# Patient Record
Sex: Male | Born: 1970 | Race: White | Hispanic: No | Marital: Married | Smoking: Never smoker
Health system: Southern US, Community
[De-identification: ages and names within clinical notes are randomized; demographics above are authoritative.]

## PROBLEM LIST (undated history)

## (undated) DIAGNOSIS — G4733 Obstructive sleep apnea (adult) (pediatric): Secondary | ICD-10-CM

## (undated) DIAGNOSIS — K219 Gastro-esophageal reflux disease without esophagitis: Secondary | ICD-10-CM

## (undated) DIAGNOSIS — I10 Essential (primary) hypertension: Secondary | ICD-10-CM

## (undated) DIAGNOSIS — E785 Hyperlipidemia, unspecified: Secondary | ICD-10-CM

## (undated) HISTORY — DX: Obstructive sleep apnea (adult) (pediatric): G47.33

## (undated) HISTORY — PX: KNEE ARTHROSCOPY: SUR90

## (undated) HISTORY — DX: Essential (primary) hypertension: I10

## (undated) HISTORY — DX: Gastro-esophageal reflux disease without esophagitis: K21.9

## (undated) HISTORY — PX: TONSILLECTOMY: SUR1361

## (undated) HISTORY — DX: Hyperlipidemia, unspecified: E78.5

---

## 2002-07-17 ENCOUNTER — Encounter: Admission: RE | Admit: 2002-07-17 | Discharge: 2002-07-17 | Payer: Self-pay | Admitting: General Surgery

## 2002-07-17 ENCOUNTER — Encounter: Payer: Self-pay | Admitting: General Surgery

## 2002-08-04 ENCOUNTER — Ambulatory Visit (HOSPITAL_COMMUNITY): Admission: RE | Admit: 2002-08-04 | Discharge: 2002-08-04 | Payer: Self-pay | Admitting: General Surgery

## 2002-08-04 ENCOUNTER — Encounter: Payer: Self-pay | Admitting: General Surgery

## 2007-12-11 ENCOUNTER — Encounter: Admission: RE | Admit: 2007-12-11 | Discharge: 2007-12-11 | Payer: Self-pay | Admitting: Orthopedic Surgery

## 2009-08-22 ENCOUNTER — Inpatient Hospital Stay (HOSPITAL_COMMUNITY): Admission: EM | Admit: 2009-08-22 | Discharge: 2009-08-24 | Payer: Self-pay | Admitting: Nurse Practitioner

## 2009-08-24 ENCOUNTER — Other Ambulatory Visit: Payer: Self-pay | Admitting: Internal Medicine

## 2010-04-14 ENCOUNTER — Ambulatory Visit (HOSPITAL_COMMUNITY)
Admission: RE | Admit: 2010-04-14 | Discharge: 2010-04-14 | Payer: Self-pay | Source: Home / Self Care | Attending: Family Medicine | Admitting: Family Medicine

## 2010-04-18 ENCOUNTER — Other Ambulatory Visit (HOSPITAL_COMMUNITY): Payer: Self-pay | Admitting: Family Medicine

## 2010-04-18 DIAGNOSIS — R1011 Right upper quadrant pain: Secondary | ICD-10-CM

## 2010-04-20 ENCOUNTER — Inpatient Hospital Stay (HOSPITAL_COMMUNITY): Admission: RE | Admit: 2010-04-20 | Payer: Self-pay | Source: Ambulatory Visit

## 2010-04-24 ENCOUNTER — Ambulatory Visit (HOSPITAL_COMMUNITY)
Admission: RE | Admit: 2010-04-24 | Discharge: 2010-04-24 | Disposition: A | Discharge status: Home / Self Care | Payer: 59 | Source: Ambulatory Visit | Attending: Family Medicine | Admitting: Family Medicine

## 2010-04-24 ENCOUNTER — Encounter (HOSPITAL_COMMUNITY): Payer: Self-pay

## 2010-04-24 DIAGNOSIS — R110 Nausea: Secondary | ICD-10-CM | POA: Insufficient documentation

## 2010-04-24 DIAGNOSIS — R1011 Right upper quadrant pain: Secondary | ICD-10-CM | POA: Insufficient documentation

## 2010-04-24 MED ORDER — TECHNETIUM TC 99M MEBROFENIN IV KIT: 5.0000 | PACK | Freq: Once | INTRAVENOUS | Status: DC | PRN

## 2010-04-24 MED ORDER — TECHNETIUM TC 99M MEBROFENIN IV KIT: 5.0000 | PACK | Freq: Once | INTRAVENOUS | Status: AC | PRN

## 2010-05-23 ENCOUNTER — Ambulatory Visit (INDEPENDENT_AMBULATORY_CARE_PROVIDER_SITE_OTHER): Payer: 59 | Admitting: Internal Medicine

## 2010-06-05 LAB — DIFFERENTIAL
Eosinophils Absolute: 0.1 10*3/uL (ref 0.0–0.7)
Eosinophils Relative: 2 % (ref 0–5)
Lymphocytes Relative: 28 % (ref 12–46)
Lymphocytes Relative: 33 % (ref 12–46)
Lymphs Abs: 2.1 10*3/uL (ref 0.7–4.0)
Lymphs Abs: 2.3 10*3/uL (ref 0.7–4.0)
Monocytes Relative: 6 % (ref 3–12)
Monocytes Relative: 7 % (ref 3–12)
Neutro Abs: 4 10*3/uL (ref 1.7–7.7)
Neutrophils Relative %: 59 % (ref 43–77)

## 2010-06-05 LAB — BASIC METABOLIC PANEL
BUN: 20 mg/dL (ref 6–23)
CO2: 27 mEq/L (ref 19–32)
Calcium: 9.1 mg/dL (ref 8.4–10.5)
Chloride: 104 mEq/L (ref 96–112)
Chloride: 108 mEq/L (ref 96–112)
Creatinine, Ser: 0.98 mg/dL (ref 0.4–1.5)
Creatinine, Ser: 1.03 mg/dL (ref 0.4–1.5)
GFR calc Af Amer: >60 mL/min (ref >60)
GFR calc Af Amer: >60 mL/min (ref >60)
GFR calc Af Amer: >60 mL/min (ref >60)
GFR calc non Af Amer: >60 mL/min (ref >60)
GFR calc non Af Amer: >60 mL/min (ref >60)
GFR calc non Af Amer: >60 mL/min (ref >60)
Potassium: 3.6 mEq/L (ref 3.5–5.1)
Sodium: 138 mEq/L (ref 135–145)
Sodium: 141 mEq/L (ref 135–145)

## 2010-06-05 LAB — PROTIME-INR
INR: 1 (ref 0.00–1.49)
Prothrombin Time: 13.1 seconds (ref 11.6–15.2)

## 2010-06-05 LAB — CARDIAC PANEL(CRET KIN+CKTOT+MB+TROPI)
CK, MB: 0.6 ng/mL (ref 0.3–4.0)
CK, MB: 0.6 ng/mL (ref 0.3–4.0)
Relative Index: INVALID (ref 0.0–2.5)
Total CK: 92 U/L (ref 7–232)
Total CK: 98 U/L (ref 7–232)
Troponin I: 0.01 ng/mL (ref 0.00–0.06)
Troponin I: 0.02 ng/mL (ref 0.00–0.06)

## 2010-06-05 LAB — LIPID PANEL
LDL Cholesterol: 139 mg/dL — ABNORMAL HIGH (ref 0–99)
Total CHOL/HDL Ratio: 5.4 RATIO
Triglycerides: 110 mg/dL (ref <150)
VLDL: 22 mg/dL (ref 0–40)

## 2010-06-05 LAB — D-DIMER, QUANTITATIVE: D-Dimer, Quant: 0.23 ug/mL-FEU (ref 0.00–0.48)

## 2010-06-05 LAB — POCT CARDIAC MARKERS
CKMB, poc: <1 ng/mL — ABNORMAL LOW (ref 1.0–8.0)
Troponin i, poc: <0.05 ng/mL (ref 0.00–0.09)

## 2010-06-05 LAB — CBC
HCT: 40.1 % (ref 39.0–52.0)
Hemoglobin: 14.1 g/dL (ref 13.0–17.0)
MCHC: 34.7 g/dL (ref 30.0–36.0)
MCV: 90.7 fL (ref 78.0–100.0)
Platelets: 236 10*3/uL (ref 150–400)
RBC: 3.96 MIL/uL — ABNORMAL LOW (ref 4.22–5.81)
RBC: 4.22 MIL/uL (ref 4.22–5.81)
RBC: 4.43 MIL/uL (ref 4.22–5.81)
WBC: 6.6 10*3/uL (ref 4.0–10.5)
WBC: 6.9 10*3/uL (ref 4.0–10.5)
WBC: 7.3 10*3/uL (ref 4.0–10.5)

## 2010-06-05 LAB — APTT: aPTT: 28 seconds (ref 24–37)

## 2012-01-07 ENCOUNTER — Telehealth (INDEPENDENT_AMBULATORY_CARE_PROVIDER_SITE_OTHER): Payer: Self-pay | Admitting: *Deleted

## 2012-01-07 NOTE — Telephone Encounter (Signed)
[facility] is requesting a refill on Dexilant DR 60 mg capsule, take 1 capsule by mouth 30 min prior to breakfast.

## 2012-01-30 ENCOUNTER — Encounter (INDEPENDENT_AMBULATORY_CARE_PROVIDER_SITE_OTHER): Payer: Self-pay | Admitting: Internal Medicine

## 2012-01-30 ENCOUNTER — Ambulatory Visit (INDEPENDENT_AMBULATORY_CARE_PROVIDER_SITE_OTHER): Payer: 59 | Admitting: Internal Medicine

## 2012-01-30 VITALS — BP 110/72 | HR 66 | Temp 98.1°F | Ht 72.0 in | Wt 293.2 lb

## 2012-01-30 DIAGNOSIS — K219 Gastro-esophageal reflux disease without esophagitis: Secondary | ICD-10-CM | POA: Insufficient documentation

## 2012-01-30 MED ORDER — DEXLANSOPRAZOLE 60 MG PO CPDR: 60.0000 mg | DELAYED_RELEASE_CAPSULE | Freq: Every day | ORAL | Status: DC

## 2012-01-30 NOTE — Progress Notes (Signed)
Subjective:     Patient ID: PIO STADTLANDER, male   DOB: 1970-10-05, 41 y.o.   MRN: [id number]  HPI Here today for f/u of his GERD. Acid reflux is controlled with Dexilant. He usually has problems at night when he sleeps. He tells me when he does have acid reflux, it is usually because he has ate too late or ate spicy foods. Appetite is good. No weight loss. Rare acid reflux. BMs usually twice a day. No GI problems. No melena or bright red rectal bleeding. No exercising.   Review of Systems see hpi Current Outpatient Prescriptions  Medication Sig Dispense Refill  . dexlansoprazole (DEXILANT) 60 MG capsule Take 60 mg by mouth daily.      Marland Kitchen lisinopril (PRINIVIL,ZESTRIL) 20 MG tablet Take 20 mg by mouth daily.       Past Medical History  Diagnosis Date  . GERD (gastroesophageal reflux disease)   . Hypertension       No Known Allergies  Objective:   Physical Exam Filed Vitals:   01/30/12 1439  BP: 110/72  Pulse: 66  Temp: 98.1 F (36.7 C)  Height: 6' (1.829 m)  Weight: 293 lb 3.2 oz (132.995 kg)   Alert and oriented. Skin warm and dry. Oral mucosa is moist.   . Sclera anicteric, conjunctivae is pink. Thyroid not enlarged. No cervical lymphadenopathy. Lungs clear. Heart regular rate and rhythm.  Abdomen is soft. Bowel sounds are positive. No hepatomegaly. No abdominal masses felt. No tenderness.  No edema to lower extremities.      Assessment:    GERD controlled this time with Dexilant.    Plan:    Try to lose weight. Continue Dexilant. [facility]. Rx for Dexilant.

## 2012-01-30 NOTE — Patient Instructions (Addendum)
Dexilant RX. Continue. OV in 1 yr.

## 2012-02-18 ENCOUNTER — Emergency Department (HOSPITAL_COMMUNITY)
Admission: EM | Admit: 2012-02-18 | Discharge: 2012-02-18 | Disposition: A | Discharge status: Home / Self Care | Payer: 59 | Attending: Emergency Medicine | Admitting: Emergency Medicine

## 2012-02-18 ENCOUNTER — Emergency Department (HOSPITAL_COMMUNITY): Payer: 59

## 2012-02-18 ENCOUNTER — Encounter (HOSPITAL_COMMUNITY): Payer: Self-pay | Admitting: *Deleted

## 2012-02-18 DIAGNOSIS — R079 Chest pain, unspecified: Secondary | ICD-10-CM | POA: Insufficient documentation

## 2012-02-18 DIAGNOSIS — F430 Acute stress reaction: Secondary | ICD-10-CM | POA: Insufficient documentation

## 2012-02-18 DIAGNOSIS — Z79899 Other long term (current) drug therapy: Secondary | ICD-10-CM | POA: Insufficient documentation

## 2012-02-18 DIAGNOSIS — K219 Gastro-esophageal reflux disease without esophagitis: Secondary | ICD-10-CM | POA: Insufficient documentation

## 2012-02-18 DIAGNOSIS — I10 Essential (primary) hypertension: Secondary | ICD-10-CM | POA: Insufficient documentation

## 2012-02-18 LAB — CBC WITH DIFFERENTIAL/PLATELET
Basophils Relative: 0 % (ref 0–1)
Eosinophils Absolute: 0.1 10*3/uL (ref 0.0–0.7)
Lymphs Abs: 1.5 10*3/uL (ref 0.7–4.0)
MCH: 30.8 pg (ref 26.0–34.0)
MCHC: 34.9 g/dL (ref 30.0–36.0)
Neutro Abs: 4.3 10*3/uL (ref 1.7–7.7)
Neutrophils Relative %: 67 % (ref 43–77)
Platelets: 287 10*3/uL (ref 150–400)
RBC: 4.77 MIL/uL (ref 4.22–5.81)

## 2012-02-18 LAB — HEPATIC FUNCTION PANEL
ALT: 56 U/L — ABNORMAL HIGH (ref 0–53)
AST: 25 U/L (ref 0–37)
Alkaline Phosphatase: 157 U/L — ABNORMAL HIGH (ref 39–117)
Total Protein: 6.9 g/dL (ref 6.0–8.3)

## 2012-02-18 LAB — BASIC METABOLIC PANEL
GFR calc Af Amer: >90 mL/min (ref >90)
GFR calc non Af Amer: 86 mL/min — ABNORMAL LOW (ref >90)
Potassium: 3.7 mEq/L (ref 3.5–5.1)
Sodium: 138 mEq/L (ref 135–145)

## 2012-02-18 NOTE — ED Provider Notes (Signed)
History     CSN: [id number]  Arrival date & time 02/18/12  1729   First MD Initiated Contact with Patient 02/18/12 1834      Chief Complaint  Patient presents with  . Chest Pain    (Consider location/radiation/quality/duration/timing/severity/associated sxs/prior treatment) Patient is a 41 y.o. male presenting with chest pain. The history is provided by the patient (the pt complains of chest pain). No language interpreter was used.  Chest Pain The chest pain began 1 - 2 hours ago. Chest pain occurs frequently. The chest pain is unchanged. The pain is associated with stress. At its most intense, the pain is at 4/10. The pain is currently at 3/10. The severity of the pain is moderate. The quality of the pain is described as aching. The pain does not radiate. Chest pain is worsened by stress. Pertinent negatives for primary symptoms include no fever, no fatigue, no cough and no abdominal pain.  Pertinent negatives for past medical history include no seizures.     Past Medical History  Diagnosis Date  . GERD (gastroesophageal reflux disease)   . Hypertension     Past Surgical History  Procedure Date  . Knee arthroscopy   . Tonsillectomy     History reviewed. No pertinent family history.  History  Substance Use Topics  . Smoking status: Never Smoker   . Smokeless tobacco: Not on file  . Alcohol Use: Yes     Comment: occasionally      Review of Systems  Constitutional: Negative for fever and fatigue.  HENT: Negative for congestion, sinus pressure and ear discharge.   Eyes: Negative for discharge.  Respiratory: Negative for cough.   Cardiovascular: Positive for chest pain.  Gastrointestinal: Negative for abdominal pain and diarrhea.  Genitourinary: Negative for frequency and hematuria.  Musculoskeletal: Negative for back pain.  Skin: Negative for rash.  Neurological: Negative for seizures and headaches.  Hematological: Negative.   Psychiatric/Behavioral: Negative  for hallucinations.    Allergies  Review of patient's allergies indicates no known allergies.  Home Medications   Current Outpatient Rx  Name  Route  Sig  Dispense  Refill  . ASPIRIN EC 325 MG PO TBEC   Oral   Take 650 mg by mouth once as needed. For chest pain         . DEXLANSOPRAZOLE 60 MG PO CPDR   Oral   Take 60 mg by mouth at bedtime.         . IBUPROFEN 200 MG PO TABS   Oral   Take 600 mg by mouth daily as needed. For occasional pain         . LISINOPRIL 40 MG PO TABS   Oral   Take 40 mg by mouth every morning.         . ADULT MULTIVITAMIN W/MINERALS CH   Oral   Take 2 tablets by mouth every morning.           BP 128/64  Pulse 86  Temp 97.8 F (36.6 C) (Oral)  Resp 20  Ht 6' (1.829 m)  Wt 280 lb (127.007 kg)  BMI 37.97 kg/m2  SpO2 99%  Physical Exam  Constitutional: He is oriented to person, place, and time. He appears well-developed.  HENT:  Head: Normocephalic and atraumatic.  Eyes: Conjunctivae normal and EOM are normal. No scleral icterus.  Neck: Neck supple. No thyromegaly present.  Cardiovascular: Normal rate and regular rhythm.  Exam reveals no gallop and no friction rub.   No  murmur heard. Pulmonary/Chest: No stridor. He has no wheezes. He has no rales. He exhibits no tenderness.  Abdominal: He exhibits no distension. There is no tenderness. There is no rebound.  Musculoskeletal: Normal range of motion. He exhibits no edema.  Lymphadenopathy:    He has no cervical adenopathy.  Neurological: He is oriented to person, place, and time. Coordination normal.  Skin: No rash noted. No erythema.  Psychiatric: He has a normal mood and affect. His behavior is normal.    ED Course  Procedures (including critical care time)  Labs Reviewed  BASIC METABOLIC PANEL - Abnormal; Notable for the following:    Glucose, Bld 107 (*)     GFR calc non Af Amer 86 (*)     All other components within normal limits  HEPATIC FUNCTION PANEL - Abnormal;  Notable for the following:    ALT 56 (*)     Alkaline Phosphatase 157 (*)     All other components within normal limits  CBC WITH DIFFERENTIAL  TROPONIN I   Dg Chest 2 View  02/18/2012  *RADIOLOGY REPORT*  Clinical Data: Chest pain.  CHEST - 2 VIEW  Comparison: 08/22/2009.  Findings: Normal sized heart.  Clear lungs with normal vascularity. Unremarkable bones.  IMPRESSION: No acute abnormality.   Original Report Authenticated By: Beckie Salts, M.D.      1. Chest pain     Date: 02/18/2012  Rate:83  Rhythm: normal sinus rhythm  QRS Axis: normal  Intervals: normal  ST/T Wave abnormalities: normal  Conduction Disutrbances:none  Narrative Interpretation:   Old EKG Reviewed: none available     MDM          Benny Lennert, MD 02/19/12 1342

## 2012-02-18 NOTE — ED Notes (Signed)
Chest "tightness" nausea, and sob,  Alert,

## 2012-05-03 ENCOUNTER — Other Ambulatory Visit: Payer: Self-pay

## 2012-08-15 ENCOUNTER — Telehealth: Payer: Self-pay | Admitting: Family Medicine

## 2012-08-15 MED ORDER — LISINOPRIL 40 MG PO TABS: 40.0000 mg | ORAL_TABLET | Freq: Every morning | ORAL | Status: DC

## 2012-08-15 NOTE — Telephone Encounter (Signed)
Rx Refilled  

## 2012-08-18 ENCOUNTER — Encounter: Payer: Self-pay | Admitting: Family Medicine

## 2012-08-18 ENCOUNTER — Ambulatory Visit (INDEPENDENT_AMBULATORY_CARE_PROVIDER_SITE_OTHER): Payer: 59 | Admitting: Family Medicine

## 2012-08-18 VITALS — BP 124/78 | HR 68 | Temp 98.3°F | Resp 18 | Wt 283.0 lb

## 2012-08-18 DIAGNOSIS — E785 Hyperlipidemia, unspecified: Secondary | ICD-10-CM | POA: Insufficient documentation

## 2012-08-18 DIAGNOSIS — B351 Tinea unguium: Secondary | ICD-10-CM

## 2012-08-18 DIAGNOSIS — I10 Essential (primary) hypertension: Secondary | ICD-10-CM

## 2012-08-18 LAB — COMPLETE METABOLIC PANEL WITH GFR
ALT: 46 U/L (ref 0–53)
BUN: 20 mg/dL (ref 6–23)
CO2: 27 mEq/L (ref 19–32)
Calcium: 9.5 mg/dL (ref 8.4–10.5)
Chloride: 104 mEq/L (ref 96–112)
Creat: 1.13 mg/dL (ref 0.50–1.35)
GFR, Est African American: >89 mL/min
GFR, Est Non African American: 79 mL/min
Glucose, Bld: 106 mg/dL — ABNORMAL HIGH (ref 70–99)

## 2012-08-18 LAB — LIPID PANEL
Cholesterol: 190 mg/dL (ref 0–200)
LDL Cholesterol: 135 mg/dL — ABNORMAL HIGH (ref 0–99)
Triglycerides: 81 mg/dL (ref <150)

## 2012-08-18 MED ORDER — TERBINAFINE HCL 250 MG PO TABS: 250.0000 mg | ORAL_TABLET | Freq: Every day | ORAL | Status: DC

## 2012-08-18 NOTE — Progress Notes (Signed)
  Subjective:    Patient ID: Travis Sanders, male    DOB: 1970-07-09, 42 y.o.   MRN: [id number]  HPI Patient is here for followup of his hypertension. He is currently taking lisinopril 40 mg by mouth daily. He denies any chest pain, shortness of breath, dyspnea on exertion, orthopnea. He also denies any daily cough. Blood pressure is currently well controlled at 124/78. His last office visit he was found to have an LDL cholesterol greater than 170. He never got pravastatin that I prescribed. He wanted to try diet and exercise. He's been unsuccessful at this. His weight has not changed. He is now willing to try the cholesterol medicines if his labs are still elevated. He requested be checked today. He has a has toenail fungus on both feet. He is pink yellow dystrophic nails on all 10 toes. He is interested in Lamisil. Past Medical History  Diagnosis Date  . Hypertension   . GERD (gastroesophageal reflux disease)   . Hyperlipidemia    Current Outpatient Prescriptions on File Prior to Visit  Medication Sig Dispense Refill  . dexlansoprazole (DEXILANT) 60 MG capsule Take 60 mg by mouth at bedtime.      Marland Kitchen ibuprofen (ADVIL,MOTRIN) 200 MG tablet Take 600 mg by mouth daily as needed. For occasional pain      . lisinopril (PRINIVIL,ZESTRIL) 40 MG tablet Take 1 tablet (40 mg total) by mouth every morning.  30 tablet  5  . Multiple Vitamin (MULTIVITAMIN WITH MINERALS) TABS Take 2 tablets by mouth every morning.       No current facility-administered medications on file prior to visit.   No Known Allergies History   Social History  . Marital Status: Married    Spouse Name: N/A    Number of Children: N/A  . Years of Education: N/A   Occupational History  . Not on file.   Social History Main Topics  . Smoking status: Never Smoker   . Smokeless tobacco: Not on file  . Alcohol Use: Yes     Comment: occasionally  . Drug Use: No  . Sexually Active: Not on file   Other Topics Concern  . Not  on file   Social History Narrative  . No narrative on file       Review of Systems  All other systems reviewed and are negative.       Objective:   Physical Exam  Vitals reviewed. Constitutional: He appears well-developed and well-nourished.  Cardiovascular: Normal rate, regular rhythm, normal heart sounds and intact distal pulses.   No murmur heard. Pulmonary/Chest: Effort normal and breath sounds normal. No respiratory distress. He has no wheezes. He has no rales.  Abdominal: Soft. Bowel sounds are normal. He exhibits no distension. There is no tenderness. There is no rebound and no guarding.          Assessment & Plan:  1. HTN (hypertension) Blood pressures currently well controlled, continue lisinopril 40 mg by mouth  2. HLD (hyperlipidemia) Goal LDL is less than 130. Check fasting lipid panel - COMPLETE METABOLIC PANEL WITH GFR - Lipid Panel  #3 onychomycosis Lamisil 250 mg by mouth daily. Dispense 30 tablets with 2 refills. Instructed the patient to return every month for a CMP while he is taking medication to monitor for  liver toxicity.

## 2012-09-03 ENCOUNTER — Encounter: Payer: Self-pay | Admitting: Family Medicine

## 2012-12-22 ENCOUNTER — Ambulatory Visit (INDEPENDENT_AMBULATORY_CARE_PROVIDER_SITE_OTHER): Payer: 59 | Admitting: Family Medicine

## 2012-12-22 ENCOUNTER — Encounter: Payer: Self-pay | Admitting: Family Medicine

## 2012-12-22 VITALS — BP 130/86 | HR 84 | Temp 98.4°F | Resp 18 | Ht 72.0 in | Wt 287.0 lb

## 2012-12-22 DIAGNOSIS — R220 Localized swelling, mass and lump, head: Secondary | ICD-10-CM

## 2012-12-22 LAB — CBC WITH DIFFERENTIAL/PLATELET
Basophils Absolute: 0 10*3/uL (ref 0.0–0.1)
Eosinophils Relative: 2 % (ref 0–5)
HCT: 42.3 % (ref 39.0–52.0)
Lymphocytes Relative: 27 % (ref 12–46)
Lymphs Abs: 1.6 10*3/uL (ref 0.7–4.0)
MCV: 87.9 fL (ref 78.0–100.0)
Monocytes Absolute: 0.4 10*3/uL (ref 0.1–1.0)
RDW: 14.2 % (ref 11.5–15.5)
WBC: 5.9 10*3/uL (ref 4.0–10.5)

## 2012-12-22 LAB — BASIC METABOLIC PANEL WITH GFR
CO2: 28 mEq/L (ref 19–32)
Chloride: 104 mEq/L (ref 96–112)
Creat: 1.12 mg/dL (ref 0.50–1.35)
Glucose, Bld: 138 mg/dL — ABNORMAL HIGH (ref 70–99)

## 2012-12-22 MED ORDER — LISINOPRIL 40 MG PO TABS: 40.0000 mg | ORAL_TABLET | Freq: Every morning | ORAL | Status: DC

## 2012-12-22 NOTE — Progress Notes (Signed)
Subjective:    Patient ID: Travis Sanders, male    DOB: 02/16/1971, 42 y.o.   MRN: [id number]  HPI  Patient reports a three-week history of mass in his left neck. The mass is located at the base of the sternocleidomastoid muscle. Is approximately 3 cm x 1 cm. The margins are not well circumscribed. There is a palpable swelling in the muscle. It is tender to palpation. The patient denies any fevers or chills. He denies any night sweats. He denies any bleeding or bruising. He denies any weight loss or fatigue. He denies any recent infection. He does complain of a sore throat and some odynophagia.  He denies any hematemesis or hemoptysis.  He has no history of tobacco abuse. Past Medical History  Diagnosis Date  . Hypertension   . GERD (gastroesophageal reflux disease)   . Hyperlipidemia    Past Surgical History  Procedure Laterality Date  . Knee arthroscopy    . Tonsillectomy     Current Outpatient Prescriptions on File Prior to Visit  Medication Sig Dispense Refill  . dexlansoprazole (DEXILANT) 60 MG capsule Take 60 mg by mouth at bedtime.      Marland Kitchen ibuprofen (ADVIL,MOTRIN) 200 MG tablet Take 600 mg by mouth daily as needed. For occasional pain      . Multiple Vitamin (MULTIVITAMIN WITH MINERALS) TABS Take 2 tablets by mouth every morning.      . terbinafine (LAMISIL) 250 MG tablet Take 1 tablet (250 mg total) by mouth daily.  30 tablet  2   No current facility-administered medications on file prior to visit.   No Known Allergies History   Social History  . Marital Status: Married    Spouse Name: N/A    Number of Children: N/A  . Years of Education: N/A   Occupational History  . Not on file.   Social History Main Topics  . Smoking status: Never Smoker   . Smokeless tobacco: Not on file  . Alcohol Use: Yes     Comment: occasionally  . Drug Use: No  . Sexual Activity: Not on file   Other Topics Concern  . Not on file   Social History Narrative  . No narrative on file      Review of Systems  All other systems reviewed and are negative.       Objective:   Physical Exam  Vitals reviewed. Constitutional: He appears well-developed and well-nourished. No distress.  HENT:  Head: Normocephalic and atraumatic.  Right Ear: External ear normal.  Left Ear: External ear normal.  Nose: Nose normal.  Mouth/Throat: Oropharynx is clear and moist. No oropharyngeal exudate.  Eyes: Conjunctivae are normal. Pupils are equal, round, and reactive to light. Right eye exhibits no discharge. Left eye exhibits no discharge. No scleral icterus.  Neck: Neck supple. No JVD present. No tracheal deviation present. No thyromegaly present.  Cardiovascular: Normal rate, regular rhythm and normal heart sounds.  Exam reveals no gallop and no friction rub.   No murmur heard. Pulmonary/Chest: Effort normal and breath sounds normal. No respiratory distress. He has no wheezes. He has no rales.  Lymphadenopathy:    He has no cervical adenopathy.  Skin: He is not diaphoretic.   2 cm x 1 cm vague mass palpable in the base of the left sternocleidomastoid muscle        Assessment & Plan:  1. Swelling, mass, or lump in head and neck Schedule patient for a CT scan of the neck. I believe this  is likely a spasm in the muscle. He is very concerned that there may be a cancer though the muscle. It is tender to palpation. However a CT scan and await results before formulating a final plan.. - BASIC METABOLIC PANEL WITH GFR - CBC with Differential

## 2012-12-26 ENCOUNTER — Encounter (HOSPITAL_COMMUNITY): Payer: Self-pay

## 2012-12-26 ENCOUNTER — Ambulatory Visit (HOSPITAL_COMMUNITY)
Admission: RE | Admit: 2012-12-26 | Discharge: 2012-12-26 | Disposition: A | Discharge status: Home / Self Care | Payer: 59 | Source: Ambulatory Visit | Attending: Family Medicine | Admitting: Family Medicine

## 2012-12-26 DIAGNOSIS — I6529 Occlusion and stenosis of unspecified carotid artery: Secondary | ICD-10-CM | POA: Insufficient documentation

## 2012-12-26 DIAGNOSIS — R220 Localized swelling, mass and lump, head: Secondary | ICD-10-CM

## 2012-12-26 DIAGNOSIS — M542 Cervicalgia: Secondary | ICD-10-CM | POA: Insufficient documentation

## 2012-12-26 MED ORDER — IOHEXOL 300 MG/ML  SOLN
80.0000 mL | Freq: Once | INTRAMUSCULAR | Status: AC | PRN
  Administered 2012-12-26: 75 mL via INTRAVENOUS

## 2012-12-29 ENCOUNTER — Other Ambulatory Visit: Payer: Self-pay | Admitting: Family Medicine

## 2012-12-29 MED ORDER — CYCLOBENZAPRINE HCL 10 MG PO TABS: 10.0000 mg | ORAL_TABLET | Freq: Three times a day (TID) | ORAL | Status: DC | PRN

## 2012-12-29 NOTE — Telephone Encounter (Signed)
Med sent to pharm 

## 2013-01-22 ENCOUNTER — Other Ambulatory Visit: Payer: Self-pay

## 2013-01-23 ENCOUNTER — Other Ambulatory Visit: Payer: Self-pay | Admitting: Family Medicine

## 2013-02-03 ENCOUNTER — Other Ambulatory Visit (INDEPENDENT_AMBULATORY_CARE_PROVIDER_SITE_OTHER): Payer: Self-pay | Admitting: Internal Medicine

## 2013-02-05 ENCOUNTER — Encounter (INDEPENDENT_AMBULATORY_CARE_PROVIDER_SITE_OTHER): Payer: Self-pay | Admitting: *Deleted

## 2013-04-10 ENCOUNTER — Telehealth (INDEPENDENT_AMBULATORY_CARE_PROVIDER_SITE_OTHER): Payer: Self-pay | Admitting: *Deleted

## 2013-04-10 NOTE — Telephone Encounter (Signed)
Patient called to check on the PA for [place]. Advised him Lynelle Smoke is working on the PA and he will be called once it has been approved. Zubayr is out his Dexilant. Spoke with Dr. Laural Golden and he said patient could get OTC Zegerid, take 2 tablets daily.

## 2013-04-10 NOTE — Telephone Encounter (Signed)
Attempted to do PA over the phone but they stated that paper work had to be done. Rec'd the paper work , working on it. Monday will get Dr.Rehman's signature and fax back. I was told that from the time they rec'v it , to allow 48-78 hours for their response.

## 2013-05-18 ENCOUNTER — Ambulatory Visit (INDEPENDENT_AMBULATORY_CARE_PROVIDER_SITE_OTHER): Payer: 59 | Admitting: Internal Medicine

## 2013-05-19 ENCOUNTER — Encounter (INDEPENDENT_AMBULATORY_CARE_PROVIDER_SITE_OTHER): Payer: Self-pay | Admitting: Internal Medicine

## 2013-05-19 ENCOUNTER — Ambulatory Visit (INDEPENDENT_AMBULATORY_CARE_PROVIDER_SITE_OTHER): Payer: BC Managed Care – PPO | Admitting: Internal Medicine

## 2013-05-19 VITALS — BP 144/80 | HR 76 | Temp 98.0°F | Resp 18 | Ht 72.0 in | Wt 284.6 lb

## 2013-05-19 DIAGNOSIS — K219 Gastro-esophageal reflux disease without esophagitis: Secondary | ICD-10-CM

## 2013-05-19 DIAGNOSIS — E669 Obesity, unspecified: Secondary | ICD-10-CM | POA: Insufficient documentation

## 2013-05-19 DIAGNOSIS — K7689 Other specified diseases of liver: Secondary | ICD-10-CM

## 2013-05-19 DIAGNOSIS — K760 Fatty (change of) liver, not elsewhere classified: Secondary | ICD-10-CM | POA: Insufficient documentation

## 2013-05-19 NOTE — Patient Instructions (Addendum)
Keep symptom diary for the next 3 months and send [place] a summary. Consider exercise 3-4 times a week as we discussed

## 2013-05-19 NOTE — Progress Notes (Signed)
Presenting complaint;  Followup for GERD.  Subjective:  Patient is a 43 year old Caucasian male who has history of GERD and is here for scheduled visit. He was last seen in November 2013. He states he is doing well as long as he watches what he eats and he stays on his medications. While he rarely experiences heartburn he still has a few episodes of nocturnal regurgitation which almost always occurs around 2 AM. He has not been able to pinpoint association with foods. He weighs at least 5 hours before he goes to bed. He he feels burping at bedtime he usually has an indication that he may have regurgitation. He also has occasional feeling of lump in his throat. He denies sore throat hoarseness chest pain or dysphagia. He has lost 9 pounds since his last visit. He realizes he has a long way to go. She denies abdominal pain melena or rectal bleeding.        Current Medications: Current Outpatient Prescriptions  Medication Sig Dispense Refill  . acetaminophen (TYLENOL) 500 MG tablet Take 500 mg by mouth as needed for fever (For Colds).      . DEXILANT 60 MG capsule TAKE ONE CAPSULE BY MOUTH EVERY DAY  30 capsule  5  . lisinopril (PRINIVIL,ZESTRIL) 40 MG tablet Take 1 tablet (40 mg total) by mouth every morning.  30 tablet  5  . Multiple Vitamin (MULTIVITAMIN WITH MINERALS) TABS Take 2 tablets by mouth every morning.      Marland Kitchen VIAGRA 100 MG tablet TAKE 1 TABLET BY MOUTH DAILY AS NEEDED  5 tablet  11   No current facility-administered medications for this visit.     Objective: Blood pressure 144/80, pulse 76, temperature 98 F (36.7 C), temperature source Oral, resp. rate 18, height 6' (1.829 m), weight 284 lb 9.6 oz (129.094 kg). Patient is alert and in no acute distress. Conjunctiva is pink. Sclera is nonicteric Oropharyngeal mucosa is normal. No neck masses or thyromegaly noted. Cardiac exam with regular rhythm normal S1 and S2. No murmur or gallop noted. Lungs are clear to  auscultation. Abdomen is full but soft and nontender without organomegaly or masses.  No LE edema or clubbing noted.  Labs/studies Results: LFTs from 08-18-2012. Bilirubin 0.5, BP 134, AST 30, ALT 46 and albumin 4.7 Ultrasound from 04/14/2010 revealed minimally echogenic liver.   Assessment:  #1. GERD. Patient has uncomplicated GERD and is doing well with therapy. He is still having intermittent nocturnal regurgitation. If this is happening too frequently he may need bedtime H2B. #2. Fatty liver. Transaminases in June 2014 were within normal limits. He must exercise and try to lose weight in order to prevent aggressive liver disease.   Plan:  Continue Dexilant at 60 mg by mouth every morning. Patient which he record as to frequency of nocturnal regurgitation and send [place] a summary in 3 months. He will try Gaviscon 2 tablets each bedtime when necessary when he has burping spells. Will consider EGD at some point in future possibly at the time of screening colonoscopy. Patient encouraged to start exercising or walking on a treadmill for at least 30 minutes each time at least 4 times a week. Office visit in one year.

## 2013-08-13 ENCOUNTER — Other Ambulatory Visit (INDEPENDENT_AMBULATORY_CARE_PROVIDER_SITE_OTHER): Payer: Self-pay | Admitting: Internal Medicine

## 2013-08-17 ENCOUNTER — Other Ambulatory Visit: Payer: Self-pay | Admitting: Family Medicine

## 2013-08-17 MED ORDER — LISINOPRIL 40 MG PO TABS: 40.0000 mg | ORAL_TABLET | Freq: Every morning | ORAL | Status: DC

## 2013-08-17 NOTE — Telephone Encounter (Signed)
Rx Refilled  

## 2014-01-27 ENCOUNTER — Other Ambulatory Visit: Payer: Self-pay | Admitting: Family Medicine

## 2014-01-28 ENCOUNTER — Encounter: Payer: Self-pay | Admitting: *Deleted

## 2014-01-28 NOTE — Telephone Encounter (Signed)
ok 

## 2014-01-28 NOTE — Telephone Encounter (Signed)
Ok to refill??  Last office visit 12/22/2012.  Last refill 01/23/2013.

## 2014-02-03 ENCOUNTER — Telehealth: Payer: Self-pay | Admitting: Family Medicine

## 2014-02-03 MED ORDER — LISINOPRIL 40 MG PO TABS: 40.0000 mg | ORAL_TABLET | Freq: Every morning | ORAL | Status: DC

## 2014-02-03 NOTE — Telephone Encounter (Signed)
Med sent to pharm for 30 days only

## 2014-02-03 NOTE — Telephone Encounter (Signed)
Patient has appt on December 7th, and is going to run out of blood pressure medication before then, would like to know if we can refill until he can get his appointment  [phone]

## 2014-02-04 ENCOUNTER — Other Ambulatory Visit: Payer: Self-pay | Admitting: Family Medicine

## 2014-02-04 ENCOUNTER — Other Ambulatory Visit (INDEPENDENT_AMBULATORY_CARE_PROVIDER_SITE_OTHER): Payer: Self-pay | Admitting: Internal Medicine

## 2014-02-04 ENCOUNTER — Encounter (INDEPENDENT_AMBULATORY_CARE_PROVIDER_SITE_OTHER): Payer: Self-pay | Admitting: *Deleted

## 2014-02-22 ENCOUNTER — Ambulatory Visit (INDEPENDENT_AMBULATORY_CARE_PROVIDER_SITE_OTHER): Payer: BC Managed Care – PPO | Admitting: Family Medicine

## 2014-02-22 ENCOUNTER — Encounter: Payer: Self-pay | Admitting: Family Medicine

## 2014-02-22 VITALS — BP 120/74 | HR 78 | Temp 97.9°F | Resp 18 | Ht 72.0 in | Wt 286.0 lb

## 2014-02-22 DIAGNOSIS — M7551 Bursitis of right shoulder: Secondary | ICD-10-CM

## 2014-02-22 DIAGNOSIS — K219 Gastro-esophageal reflux disease without esophagitis: Secondary | ICD-10-CM

## 2014-02-22 DIAGNOSIS — R053 Chronic cough: Secondary | ICD-10-CM

## 2014-02-22 DIAGNOSIS — I10 Essential (primary) hypertension: Secondary | ICD-10-CM

## 2014-02-22 DIAGNOSIS — R05 Cough: Secondary | ICD-10-CM

## 2014-02-23 ENCOUNTER — Encounter: Payer: Self-pay | Admitting: Family Medicine

## 2014-02-23 NOTE — Progress Notes (Signed)
Subjective:    Patient ID: Travis Sanders, male    DOB: September 10, 1970, 43 y.o.   MRN: [id number]  HPI  Patient is here today for follow-up of his hypertension.  He denies any chest pain shortness of breath or dyspnea on exertion. He does have a very recalcitrant acid reflux currently managed with dexilant.  Frequently the patient will experience reflux even on the medication consisting of a burning foul tasting bitter fluid regurgitated into his mouth when he lies down at night.  He also reports a dry hacking nonproductive cough for over 2-3 months. Patient also complains of right shoulder pain 1 month. The pain is worse with abduction greater than 90. It is exacerbated by internal and external rotation of the shoulder. It is worse with overhead activities. Past Medical History  Diagnosis Date  . Hypertension   . GERD (gastroesophageal reflux disease)   . Hyperlipidemia    Past Surgical History  Procedure Laterality Date  . Knee arthroscopy    . Tonsillectomy     Current Outpatient Prescriptions on File Prior to Visit  Medication Sig Dispense Refill  . DEXILANT 60 MG capsule TAKE ONE CAPSULE BY MOUTH EVERY DAY 30 capsule 5  . lisinopril (PRINIVIL,ZESTRIL) 40 MG tablet Take 1 tablet (40 mg total) by mouth every morning. 30 tablet 0  . Multiple Vitamin (MULTIVITAMIN WITH MINERALS) TABS Take 2 tablets by mouth every morning.    Marland Kitchen VIAGRA 100 MG tablet TAKE 1 TABLET BY MOUTH EVERY DAY AS NEEDED 5 tablet 9   No current facility-administered medications on file prior to visit.   No Known Allergies History   Social History  . Marital Status: Married    Spouse Name: N/A    Number of Children: N/A  . Years of Education: N/A   Occupational History  . Not on file.   Social History Main Topics  . Smoking status: Never Smoker   . Smokeless tobacco: Never Used  . Alcohol Use: Yes     Comment: occasionally  . Drug Use: No  . Sexual Activity: Not on file   Other Topics Concern  .  Not on file   Social History Narrative     Review of Systems  All other systems reviewed and are negative.      Objective:   Physical Exam  Cardiovascular: Normal rate, regular rhythm and normal heart sounds.   No murmur heard. Pulmonary/Chest: Effort normal and breath sounds normal. No respiratory distress. He has no wheezes. He has no rales.  Abdominal: Soft. Bowel sounds are normal. He exhibits no distension. There is no tenderness. There is no rebound and no guarding.  Musculoskeletal: He exhibits no edema.       Right shoulder: He exhibits decreased range of motion, tenderness and pain. He exhibits no bony tenderness and normal strength.  Vitals reviewed.         Assessment & Plan:  Benign essential HTN  Subacromial bursitis, right  Chronic cough  Gastroesophageal reflux disease without esophagitis  Patient's blood pressures well controlled. I will have the patient return fasting for a CMP and fasting lipid panel. I will have the patient discontinue lisinopril due to the cough. I will replace the medication with Benicar 40 mg by mouth daily. If his cough subsides, I will permanently switch the patient to losartan 100 mg by mouth daily in 3 weeks. The cough may also be secondary to his acid reflux. Therefore I recommended that he elevate the head of his  bed 2 inches to help prevent reflux at night. I believe the patient has subacromial bursitis and tendinitis in his right shoulder. We discussed conservative strategies to help manage this. If no better in 3 weeks, the patient like to proceed with cortisone injection at that time.

## 2014-03-08 ENCOUNTER — Telehealth: Payer: Self-pay | Admitting: Family Medicine

## 2014-03-08 ENCOUNTER — Other Ambulatory Visit: Payer: BC Managed Care – PPO

## 2014-03-08 DIAGNOSIS — I10 Essential (primary) hypertension: Secondary | ICD-10-CM

## 2014-03-08 MED ORDER — LOSARTAN POTASSIUM 100 MG PO TABS: 100.0000 mg | ORAL_TABLET | Freq: Every day | ORAL | Status: DC

## 2014-03-08 NOTE — Telephone Encounter (Signed)
[phone]  PT was given samples of the benicar for (bp) and it has worked well and he would like to have a rx sent in [place]

## 2014-03-08 NOTE — Telephone Encounter (Signed)
Called and spoke to pt and he is aware that we are switching him to losartan. Med sent to pharm.

## 2014-03-09 LAB — COMPLETE METABOLIC PANEL WITH GFR
ALBUMIN: 4.3 g/dL (ref 3.5–5.2)
ALK PHOS: 131 U/L — AB (ref 39–117)
ALT: 43 U/L (ref 0–53)
AST: 25 U/L (ref 0–37)
BILIRUBIN TOTAL: 0.5 mg/dL (ref 0.2–1.2)
BUN: 17 mg/dL (ref 6–23)
CO2: 25 mEq/L (ref 19–32)
Calcium: 9.4 mg/dL (ref 8.4–10.5)
Chloride: 101 mEq/L (ref 96–112)
Creat: 1.07 mg/dL (ref 0.50–1.35)
GFR, Est African American: >89 mL/min
GFR, Est Non African American: 83 mL/min
GLUCOSE: 102 mg/dL — AB (ref 70–99)
POTASSIUM: 4.7 meq/L (ref 3.5–5.3)
SODIUM: 142 meq/L (ref 135–145)
TOTAL PROTEIN: 6.7 g/dL (ref 6.0–8.3)

## 2014-03-09 LAB — LIPID PANEL
CHOL/HDL RATIO: 5.2 ratio
Cholesterol: 204 mg/dL — ABNORMAL HIGH (ref 0–200)
HDL: 39 mg/dL — AB (ref >39)
LDL CALC: 148 mg/dL — AB (ref 0–99)
Triglycerides: 86 mg/dL (ref <150)
VLDL: 17 mg/dL (ref 0–40)

## 2014-04-16 ENCOUNTER — Telehealth (INDEPENDENT_AMBULATORY_CARE_PROVIDER_SITE_OTHER): Payer: Self-pay | Admitting: *Deleted

## 2014-04-16 NOTE — Telephone Encounter (Signed)
A call was made to the patient's insurance. PA was done. Dexilant is NOT a preferred PPI. PA will be forwarded to review board for review and we as well as patient will be mailed a response.

## 2014-04-21 ENCOUNTER — Telehealth: Payer: Self-pay | Admitting: Family Medicine

## 2014-04-21 NOTE — Telephone Encounter (Signed)
Received fax from [place] stating that they do not cover this medication and will not accept a PA cause there is no diagnosis that will cover it. - See scanned document.

## 2014-05-20 ENCOUNTER — Ambulatory Visit (INDEPENDENT_AMBULATORY_CARE_PROVIDER_SITE_OTHER): Payer: BLUE CROSS/BLUE SHIELD | Admitting: Internal Medicine

## 2014-05-20 ENCOUNTER — Encounter (INDEPENDENT_AMBULATORY_CARE_PROVIDER_SITE_OTHER): Payer: Self-pay | Admitting: Internal Medicine

## 2014-05-20 VITALS — BP 150/84 | HR 72 | Temp 97.5°F | Ht 72.0 in | Wt 290.3 lb

## 2014-05-20 DIAGNOSIS — K219 Gastro-esophageal reflux disease without esophagitis: Secondary | ICD-10-CM

## 2014-05-20 NOTE — Patient Instructions (Signed)
Continue the Dexilant. OV in 1 year. 

## 2014-05-20 NOTE — Progress Notes (Signed)
   Subjective:    Patient ID: Travis Sanders, male    DOB: 22-Jan-1971, 44 y.o.   MRN: [id number]  HPI Here today for f/u of his GERD. Gerd controlled with Dexilant. Appetite is good. No weight loss. No abdominal pain. Usually has a BM once a day. Spicy foods he says do not bother him. His acid reflux is worse at night. He says he has had 3 episodes of acid reflux in the past year.     Review of Systems Past Medical History  Diagnosis Date  . Hypertension   . GERD (gastroesophageal reflux disease)   . Hyperlipidemia     Past Surgical History  Procedure Laterality Date  . Knee arthroscopy    . Tonsillectomy      No Known Allergies  Current Outpatient Prescriptions on File Prior to Visit  Medication Sig Dispense Refill  . DEXILANT 60 MG capsule TAKE ONE CAPSULE BY MOUTH EVERY DAY 30 capsule 5  . losartan (COZAAR) 100 MG tablet Take 1 tablet (100 mg total) by mouth daily. 30 tablet 11  . Multiple Vitamin (MULTIVITAMIN WITH MINERALS) TABS Take 2 tablets by mouth every morning.    Marland Kitchen VIAGRA 100 MG tablet TAKE 1 TABLET BY MOUTH EVERY DAY AS NEEDED 5 tablet 9   No current facility-administered medications on file prior to visit.       Married. One natural child in good health.  Objective:   Physical Exam Blood pressure 150/84, pulse 72, temperature 97.5 F (36.4 C), height 6' (1.829 m), weight 290 lb 4.8 oz (131.679 kg). Alert and oriented. Skin warm and dry. Oral mucosa is moist.   . Sclera anicteric, conjunctivae is pink. Thyroid not enlarged. No cervical lymphadenopathy. Lungs clear. Heart regular rate and rhythm.  Abdomen is soft. Bowel sounds are positive. No hepatomegaly. No abdominal masses felt. No tenderness.  No edema to lower extremities.         Assessment & Plan:  GERD controlled at this with Dexilant.  OV in 1 y;r.

## 2014-08-25 ENCOUNTER — Other Ambulatory Visit (INDEPENDENT_AMBULATORY_CARE_PROVIDER_SITE_OTHER): Payer: Self-pay | Admitting: Internal Medicine

## 2014-10-04 ENCOUNTER — Telehealth: Payer: Self-pay | Admitting: Family Medicine

## 2014-10-04 NOTE — Telephone Encounter (Signed)
Rec'd form from patient to fax to insurance asking for Prior Auth for Viagra.  Form was faxed.  Rec'd notice today from [facility] stating "PA is not required.  Medication is not covered".  [employer] Case ID [id number].  Have left message for pt to call back to inform

## 2014-10-04 NOTE — Telephone Encounter (Signed)
Pt made aware of medication denial.  He said he will contact his insurance.

## 2015-01-26 ENCOUNTER — Encounter (INDEPENDENT_AMBULATORY_CARE_PROVIDER_SITE_OTHER): Payer: Self-pay | Admitting: *Deleted

## 2015-02-15 ENCOUNTER — Other Ambulatory Visit: Payer: BLUE CROSS/BLUE SHIELD

## 2015-02-15 DIAGNOSIS — Z0000 Encounter for general adult medical examination without abnormal findings: Secondary | ICD-10-CM

## 2015-02-15 DIAGNOSIS — E669 Obesity, unspecified: Secondary | ICD-10-CM

## 2015-02-15 DIAGNOSIS — K760 Fatty (change of) liver, not elsewhere classified: Secondary | ICD-10-CM

## 2015-02-15 DIAGNOSIS — E785 Hyperlipidemia, unspecified: Secondary | ICD-10-CM

## 2015-02-15 DIAGNOSIS — Z79899 Other long term (current) drug therapy: Secondary | ICD-10-CM

## 2015-02-15 DIAGNOSIS — I10 Essential (primary) hypertension: Secondary | ICD-10-CM

## 2015-02-15 LAB — COMPLETE METABOLIC PANEL WITH GFR
ALT: 34 U/L (ref 9–46)
AST: 23 U/L (ref 10–40)
Albumin: 4.4 g/dL (ref 3.6–5.1)
Alkaline Phosphatase: 120 U/L — ABNORMAL HIGH (ref 40–115)
BUN: 19 mg/dL (ref 7–25)
CO2: 28 mmol/L (ref 20–31)
Calcium: 9.5 mg/dL (ref 8.6–10.3)
Chloride: 103 mmol/L (ref 98–110)
Creat: 1.01 mg/dL (ref 0.60–1.35)
GFR, EST NON AFRICAN AMERICAN: 89 mL/min (ref >=60)
GLUCOSE: 96 mg/dL (ref 70–99)
POTASSIUM: 4.8 mmol/L (ref 3.5–5.3)
SODIUM: 139 mmol/L (ref 135–146)
Total Bilirubin: 0.4 mg/dL (ref 0.2–1.2)
Total Protein: 6.6 g/dL (ref 6.1–8.1)

## 2015-02-15 LAB — LIPID PANEL
CHOLESTEROL: 175 mg/dL (ref 125–200)
HDL: 48 mg/dL (ref >=40)
LDL CALC: 118 mg/dL (ref <130)
TRIGLYCERIDES: 45 mg/dL (ref <150)
Total CHOL/HDL Ratio: 3.6 Ratio (ref <=5.0)
VLDL: 9 mg/dL (ref <30)

## 2015-02-15 LAB — CBC WITH DIFFERENTIAL/PLATELET
BASOS PCT: 1 % (ref 0–1)
Basophils Absolute: 0.1 10*3/uL (ref 0.0–0.1)
Eosinophils Absolute: 0.2 10*3/uL (ref 0.0–0.7)
Eosinophils Relative: 3 % (ref 0–5)
HCT: 40.8 % (ref 39.0–52.0)
HEMOGLOBIN: 13.3 g/dL (ref 13.0–17.0)
LYMPHS ABS: 1.7 10*3/uL (ref 0.7–4.0)
Lymphocytes Relative: 33 % (ref 12–46)
MCH: 30.2 pg (ref 26.0–34.0)
MCHC: 32.6 g/dL (ref 30.0–36.0)
MCV: 92.5 fL (ref 78.0–100.0)
MONO ABS: 0.3 10*3/uL (ref 0.1–1.0)
MONOS PCT: 6 % (ref 3–12)
MPV: 9.7 fL (ref 8.6–12.4)
NEUTROS ABS: 3 10*3/uL (ref 1.7–7.7)
NEUTROS PCT: 57 % (ref 43–77)
Platelets: 318 10*3/uL (ref 150–400)
RBC: 4.41 MIL/uL (ref 4.22–5.81)
RDW: 13.8 % (ref 11.5–15.5)
WBC: 5.3 10*3/uL (ref 4.0–10.5)

## 2015-02-15 LAB — TSH: TSH: 2.183 u[IU]/mL (ref 0.350–4.500)

## 2015-02-17 ENCOUNTER — Encounter: Payer: Self-pay | Admitting: Family Medicine

## 2015-02-17 ENCOUNTER — Ambulatory Visit (INDEPENDENT_AMBULATORY_CARE_PROVIDER_SITE_OTHER): Payer: BLUE CROSS/BLUE SHIELD | Admitting: Family Medicine

## 2015-02-17 VITALS — BP 130/80 | HR 72 | Temp 97.7°F | Resp 18 | Ht 72.0 in | Wt 258.0 lb

## 2015-02-17 DIAGNOSIS — Z0000 Encounter for general adult medical examination without abnormal findings: Secondary | ICD-10-CM

## 2015-02-17 DIAGNOSIS — I10 Essential (primary) hypertension: Secondary | ICD-10-CM

## 2015-02-17 MED ORDER — OMEPRAZOLE 40 MG PO CPDR: 40.0000 mg | DELAYED_RELEASE_CAPSULE | Freq: Every day | ORAL | Status: DC

## 2015-02-17 MED ORDER — SILDENAFIL CITRATE 100 MG PO TABS: 100.0000 mg | ORAL_TABLET | Freq: Every day | ORAL | Status: DC | PRN

## 2015-02-17 NOTE — Progress Notes (Signed)
Subjective:    Patient ID: Travis Sanders, male    DOB: Jul 19, 1970, 44 y.o.   MRN: [id number]  HPI Patient is here today for complete physical exam. I'm extremely proud of him. Since his last office visit he has lost 28 pounds. His blood pressure today is good at 130/80. He is interested in trying to decrease the dose of losartan. His insurance will not cover dexilant.  Since he has lost so much weight, he is interested to see if we can try weaker generic medication that may do just as well now. He would like to try omeprazole. I went ahead and gave him a prescription for omeprazole 40 mg by mouth daily and senna to his pharmacy. His most recent lab work as listed below. He is already had his flu shot at work and he declines a tetanus shot. Lab on 02/15/2015  Component Date Value Ref Range Status  . Sodium 02/15/2015 139  135 - 146 mmol/L Final  . Potassium 02/15/2015 4.8  3.5 - 5.3 mmol/L Final  . Chloride 02/15/2015 103  98 - 110 mmol/L Final  . CO2 02/15/2015 28  20 - 31 mmol/L Final  . Glucose, Bld 02/15/2015 96  70 - 99 mg/dL Final  . BUN 02/15/2015 19  7 - 25 mg/dL Final  . Creat 02/15/2015 1.01  0.60 - 1.35 mg/dL Final  . Total Bilirubin 02/15/2015 0.4  0.2 - 1.2 mg/dL Final  . Alkaline Phosphatase 02/15/2015 120* 40 - 115 U/L Final  . AST 02/15/2015 23  10 - 40 U/L Final  . ALT 02/15/2015 34  9 - 46 U/L Final  . Total Protein 02/15/2015 6.6  6.1 - 8.1 g/dL Final  . Albumin 02/15/2015 4.4  3.6 - 5.1 g/dL Final  . Calcium 02/15/2015 9.5  8.6 - 10.3 mg/dL Final  . GFR, Est African American 02/15/2015 >89  >=60 mL/min Final  . GFR, Est Non African American 02/15/2015 89  >=60 mL/min Final   Comment:   The estimated GFR is a calculation valid for adults (>=56 years old) that uses the CKD-EPI algorithm to adjust for age and sex. It is   not to be used for children, pregnant women, hospitalized patients,    patients on dialysis, or with rapidly changing kidney function. According  to the NKDEP, eGFR >89 is normal, 60-89 shows mild impairment, 30-59 shows moderate impairment, 15-29 shows severe impairment and <15 is ESRD.     Marland Kitchen TSH 02/15/2015 2.183  0.350 - 4.500 uIU/mL Final  . Cholesterol 02/15/2015 175  125 - 200 mg/dL Final  . Triglycerides 02/15/2015 45  <150 mg/dL Final  . HDL 02/15/2015 48  >=40 mg/dL Final  . Total CHOL/HDL Ratio 02/15/2015 3.6  <=5.0 Ratio Final  . VLDL 02/15/2015 9  <30 mg/dL Final  . LDL Cholesterol 02/15/2015 118  <130 mg/dL Final   Comment:   Total Cholesterol/HDL Ratio:CHD Risk                        Coronary Heart Disease Risk Table                                        Men       Women          1/2 Average Risk  3.4        3.3              Average Risk              5.0        4.4           2X Average Risk              9.6        7.1           3X Average Risk             23.4       11.0 Use the calculated Patient Ratio above and the CHD Risk table  to determine the patient's CHD Risk.   . WBC 02/15/2015 5.3  4.0 - 10.5 K/uL Final  . RBC 02/15/2015 4.41  4.22 - 5.81 MIL/uL Final  . Hemoglobin 02/15/2015 13.3  13.0 - 17.0 g/dL Final  . HCT 02/15/2015 40.8  39.0 - 52.0 % Final  . MCV 02/15/2015 92.5  78.0 - 100.0 fL Final  . MCH 02/15/2015 30.2  26.0 - 34.0 pg Final  . MCHC 02/15/2015 32.6  30.0 - 36.0 g/dL Final  . RDW 02/15/2015 13.8  11.5 - 15.5 % Final  . Platelets 02/15/2015 318  150 - 400 K/uL Final  . MPV 02/15/2015 9.7  8.6 - 12.4 fL Final  . Neutrophils Relative % 02/15/2015 57  43 - 77 % Final  . Neutro Abs 02/15/2015 3.0  1.7 - 7.7 K/uL Final  . Lymphocytes Relative 02/15/2015 33  12 - 46 % Final  . Lymphs Abs 02/15/2015 1.7  0.7 - 4.0 K/uL Final  . Monocytes Relative 02/15/2015 6  3 - 12 % Final  . Monocytes Absolute 02/15/2015 0.3  0.1 - 1.0 K/uL Final  . Eosinophils Relative 02/15/2015 3  0 - 5 % Final  . Eosinophils Absolute 02/15/2015 0.2  0.0 - 0.7 K/uL Final  . Basophils Relative 02/15/2015 1  0  - 1 % Final  . Basophils Absolute 02/15/2015 0.1  0.0 - 0.1 K/uL Final  . Smear Review 02/15/2015 Criteria for review not met   Final   Past Medical History  Diagnosis Date  . Hypertension   . GERD (gastroesophageal reflux disease)   . Hyperlipidemia    Past Surgical History  Procedure Laterality Date  . Knee arthroscopy    . Tonsillectomy     Current Outpatient Prescriptions on File Prior to Visit  Medication Sig Dispense Refill  . DEXILANT 60 MG capsule TAKE ONE CAPSULE BY MOUTH EVERY DAY 30 capsule 5  . losartan (COZAAR) 100 MG tablet Take 1 tablet (100 mg total) by mouth daily. 30 tablet 11  . Multiple Vitamin (MULTIVITAMIN WITH MINERALS) TABS Take 2 tablets by mouth every morning.     No current facility-administered medications on file prior to visit.   No Known Allergies Social History   Social History  . Marital Status: Married    Spouse Name: N/A  . Number of Children: N/A  . Years of Education: N/A   Occupational History  . Not on file.   Social History Main Topics  . Smoking status: Never Smoker   . Smokeless tobacco: Never Used  . Alcohol Use: Yes     Comment: occasionally  . Drug Use: No  . Sexual Activity: Not on file   Other Topics Concern  . Not on file   Social History Narrative  No family history on file.    Review of Systems  All other systems reviewed and are negative.      Objective:   Physical Exam  Constitutional: He is oriented to person, place, and time. He appears well-developed and well-nourished. No distress.  HENT:  Head: Normocephalic and atraumatic.  Right Ear: External ear normal.  Left Ear: External ear normal.  Nose: Nose normal.  Mouth/Throat: Oropharynx is clear and moist. No oropharyngeal exudate.  Eyes: Conjunctivae and EOM are normal. Pupils are equal, round, and reactive to light. Right eye exhibits no discharge. Left eye exhibits no discharge. No scleral icterus.  Neck: Normal range of motion. Neck supple. No  JVD present. No tracheal deviation present. No thyromegaly present.  Cardiovascular: Normal rate, regular rhythm, normal heart sounds and intact distal pulses.  Exam reveals no gallop and no friction rub.   No murmur heard. Pulmonary/Chest: Effort normal and breath sounds normal. No stridor. No respiratory distress. He has no wheezes. He has no rales. He exhibits no tenderness.  Abdominal: Soft. Bowel sounds are normal. He exhibits no distension and no mass. There is no tenderness. There is no rebound and no guarding.  Musculoskeletal: Normal range of motion. He exhibits no edema or tenderness.  Lymphadenopathy:    He has no cervical adenopathy.  Neurological: He is alert and oriented to person, place, and time. He has normal reflexes. He displays normal reflexes. No cranial nerve deficit. He exhibits normal muscle tone. Coordination normal.  Skin: Skin is warm. No rash noted. He is not diaphoretic. No erythema. No pallor.  Psychiatric: He has a normal mood and affect. His behavior is normal. Judgment and thought content normal.  Vitals reviewed.         Assessment & Plan:  Routine general medical examination at a health care facility  Benign essential HTN  Decrease losartan to 50 mg a day. Resume previous dose of his blood pressure rises greater than 140/90. Blood sugar and cholesterol are much improved. Immunizations are up-to-date. He is not yet due for a colonoscopy or prostate exam. Discontinue dexilant and replaced with omeprazole 40 mg by mouth daily. Regular anticipatory guidance is provided. Follow-up in one year or as needed

## 2015-03-03 ENCOUNTER — Other Ambulatory Visit: Payer: Self-pay | Admitting: Family Medicine

## 2015-03-03 NOTE — Telephone Encounter (Signed)
Refill appropriate and filled per protocol. 

## 2015-05-31 ENCOUNTER — Ambulatory Visit (INDEPENDENT_AMBULATORY_CARE_PROVIDER_SITE_OTHER): Payer: BLUE CROSS/BLUE SHIELD | Admitting: Internal Medicine

## 2015-06-01 ENCOUNTER — Encounter (INDEPENDENT_AMBULATORY_CARE_PROVIDER_SITE_OTHER): Payer: Self-pay | Admitting: Internal Medicine

## 2016-02-22 ENCOUNTER — Other Ambulatory Visit: Payer: Self-pay | Admitting: Family Medicine

## 2016-02-24 ENCOUNTER — Other Ambulatory Visit: Payer: Self-pay | Admitting: Family Medicine

## 2016-02-24 ENCOUNTER — Other Ambulatory Visit: Payer: BLUE CROSS/BLUE SHIELD

## 2016-02-24 DIAGNOSIS — E785 Hyperlipidemia, unspecified: Secondary | ICD-10-CM

## 2016-02-24 DIAGNOSIS — Z0000 Encounter for general adult medical examination without abnormal findings: Secondary | ICD-10-CM

## 2016-02-24 DIAGNOSIS — K760 Fatty (change of) liver, not elsewhere classified: Secondary | ICD-10-CM

## 2016-02-24 DIAGNOSIS — Z79899 Other long term (current) drug therapy: Secondary | ICD-10-CM

## 2016-02-24 DIAGNOSIS — K219 Gastro-esophageal reflux disease without esophagitis: Secondary | ICD-10-CM

## 2016-02-24 DIAGNOSIS — I10 Essential (primary) hypertension: Secondary | ICD-10-CM

## 2016-02-24 LAB — CBC WITH DIFFERENTIAL/PLATELET
BASOS PCT: 0 %
Basophils Absolute: 0 cells/uL (ref 0–200)
EOS PCT: 5 %
Eosinophils Absolute: 270 cells/uL (ref 15–500)
HCT: 43 % (ref 38.5–50.0)
HEMOGLOBIN: 14.2 g/dL (ref 13.0–17.0)
LYMPHS ABS: 1674 {cells}/uL (ref 850–3900)
Lymphocytes Relative: 31 %
MCH: 30.5 pg (ref 27.0–33.0)
MCHC: 33 g/dL (ref 32.0–36.0)
MCV: 92.3 fL (ref 80.0–100.0)
MPV: 9.3 fL (ref 7.5–12.5)
Monocytes Absolute: 432 cells/uL (ref 200–950)
Monocytes Relative: 8 %
NEUTROS ABS: 3024 {cells}/uL (ref 1500–7800)
Neutrophils Relative %: 56 %
Platelets: 283 10*3/uL (ref 140–400)
RBC: 4.66 MIL/uL (ref 4.20–5.80)
RDW: 13.7 % (ref 11.0–15.0)
WBC: 5.4 10*3/uL (ref 3.8–10.8)

## 2016-02-24 LAB — COMPLETE METABOLIC PANEL WITH GFR
ALT: 68 U/L — ABNORMAL HIGH (ref 9–46)
AST: 23 U/L (ref 10–40)
Albumin: 4.4 g/dL (ref 3.6–5.1)
Alkaline Phosphatase: 101 U/L (ref 40–115)
BILIRUBIN TOTAL: 0.4 mg/dL (ref 0.2–1.2)
BUN: 19 mg/dL (ref 7–25)
CHLORIDE: 104 mmol/L (ref 98–110)
CO2: 25 mmol/L (ref 20–31)
Calcium: 9.3 mg/dL (ref 8.6–10.3)
Creat: 1.1 mg/dL (ref 0.60–1.35)
GFR, EST NON AFRICAN AMERICAN: 80 mL/min (ref >=60)
Glucose, Bld: 99 mg/dL (ref 70–99)
Potassium: 4.6 mmol/L (ref 3.5–5.3)
Sodium: 140 mmol/L (ref 135–146)
TOTAL PROTEIN: 6.7 g/dL (ref 6.1–8.1)

## 2016-02-24 LAB — LIPID PANEL
Cholesterol: 196 mg/dL (ref <200)
HDL: 47 mg/dL (ref >40)
LDL CALC: 139 mg/dL — AB (ref <100)
TRIGLYCERIDES: 49 mg/dL (ref <150)
Total CHOL/HDL Ratio: 4.2 Ratio (ref <5.0)
VLDL: 10 mg/dL (ref <30)

## 2016-02-24 LAB — TSH: TSH: 1.65 mIU/L (ref 0.40–4.50)

## 2016-02-25 ENCOUNTER — Other Ambulatory Visit: Payer: Self-pay | Admitting: Family Medicine

## 2016-02-28 ENCOUNTER — Ambulatory Visit (INDEPENDENT_AMBULATORY_CARE_PROVIDER_SITE_OTHER): Payer: BLUE CROSS/BLUE SHIELD | Admitting: Family Medicine

## 2016-02-28 ENCOUNTER — Encounter: Payer: Self-pay | Admitting: Family Medicine

## 2016-02-28 VITALS — BP 134/88 | HR 84 | Temp 98.9°F | Resp 18 | Ht 72.0 in | Wt 275.0 lb

## 2016-02-28 DIAGNOSIS — Z0000 Encounter for general adult medical examination without abnormal findings: Secondary | ICD-10-CM | POA: Diagnosis not present

## 2016-02-28 DIAGNOSIS — I10 Essential (primary) hypertension: Secondary | ICD-10-CM

## 2016-02-28 DIAGNOSIS — E6609 Other obesity due to excess calories: Secondary | ICD-10-CM

## 2016-02-28 MED ORDER — SILDENAFIL CITRATE 100 MG PO TABS: 100.0000 mg | ORAL_TABLET | Freq: Every day | ORAL | 3 refills | Status: DC | PRN

## 2016-02-28 MED ORDER — LOSARTAN POTASSIUM 100 MG PO TABS: 100.0000 mg | ORAL_TABLET | Freq: Every day | ORAL | 3 refills | Status: DC

## 2016-02-28 NOTE — Progress Notes (Signed)
Subjective:    Patient ID: Travis Sanders, male    DOB: 1971/02/06, 45 y.o.   MRN: [id number]  HPI Patient is here today for complete physical exam. Patient has recently gained substantial weight. He admits that he is not exercising. He is also not following any specific diet. Liver function test and also recently elevated with an ALT of 68 consistent with fatty liver disease. Patient has no family history of prostate cancer colon cancer and therefore is not due for the screening until he turns age 2. He had his flu shot outside of work. His liver test is slightly elevated. He denies any alcohol use or Tylenol use. He has a past history of similar mild elevations in his liver function tests attributed to fatty liver disease by gastroenterologist. Appointment on 02/24/2016  Component Date Value Ref Range Status  . Sodium 02/24/2016 140  135 - 146 mmol/L Final  . Potassium 02/24/2016 4.6  3.5 - 5.3 mmol/L Final  . Chloride 02/24/2016 104  98 - 110 mmol/L Final  . CO2 02/24/2016 25  20 - 31 mmol/L Final  . Glucose, Bld 02/24/2016 99  70 - 99 mg/dL Final  . BUN 02/24/2016 19  7 - 25 mg/dL Final  . Creat 02/24/2016 1.10  0.60 - 1.35 mg/dL Final  . Total Bilirubin 02/24/2016 0.4  0.2 - 1.2 mg/dL Final  . Alkaline Phosphatase 02/24/2016 101  40 - 115 U/L Final  . AST 02/24/2016 23  10 - 40 U/L Final  . ALT 02/24/2016 68* 9 - 46 U/L Final  . Total Protein 02/24/2016 6.7  6.1 - 8.1 g/dL Final  . Albumin 02/24/2016 4.4  3.6 - 5.1 g/dL Final  . Calcium 02/24/2016 9.3  8.6 - 10.3 mg/dL Final  . GFR, Est African American 02/24/2016 >89  >=60 mL/min Final  . GFR, Est Non African American 02/24/2016 80  >=60 mL/min Final  . TSH 02/24/2016 1.65  0.40 - 4.50 mIU/L Final  . Cholesterol 02/24/2016 196  <200 mg/dL Final  . Triglycerides 02/24/2016 49  <150 mg/dL Final  . HDL 02/24/2016 47  >40 mg/dL Final  . Total CHOL/HDL Ratio 02/24/2016 4.2  <5.0 Ratio Final  . VLDL 02/24/2016 10  <30 mg/dL Final    . LDL Cholesterol 02/24/2016 139* <100 mg/dL Final  . WBC 02/24/2016 5.4  3.8 - 10.8 K/uL Final  . RBC 02/24/2016 4.66  4.20 - 5.80 MIL/uL Final  . Hemoglobin 02/24/2016 14.2  13.0 - 17.0 g/dL Final  . HCT 02/24/2016 43.0  38.5 - 50.0 % Final  . MCV 02/24/2016 92.3  80.0 - 100.0 fL Final  . MCH 02/24/2016 30.5  27.0 - 33.0 pg Final  . MCHC 02/24/2016 33.0  32.0 - 36.0 g/dL Final  . RDW 02/24/2016 13.7  11.0 - 15.0 % Final  . Platelets 02/24/2016 283  140 - 400 K/uL Final  . MPV 02/24/2016 9.3  7.5 - 12.5 fL Final  . Neutro Abs 02/24/2016 3024  1,500 - 7,800 cells/uL Final  . Lymphs Abs 02/24/2016 1674  850 - 3,900 cells/uL Final  . Monocytes Absolute 02/24/2016 432  200 - 950 cells/uL Final  . Eosinophils Absolute 02/24/2016 270  15 - 500 cells/uL Final  . Basophils Absolute 02/24/2016 0  0 - 200 cells/uL Final  . Neutrophils Relative % 02/24/2016 56  % Final  . Lymphocytes Relative 02/24/2016 31  % Final  . Monocytes Relative 02/24/2016 8  % Final  . Eosinophils Relative 02/24/2016  5  % Final  . Basophils Relative 02/24/2016 0  % Final  . Smear Review 02/24/2016 Criteria for review not met   Final   Past Medical History:  Diagnosis Date  . GERD (gastroesophageal reflux disease)   . Hyperlipidemia   . Hypertension    Past Surgical History:  Procedure Laterality Date  . KNEE ARTHROSCOPY    . TONSILLECTOMY     Current Outpatient Prescriptions on File Prior to Visit  Medication Sig Dispense Refill  . Multiple Vitamin (MULTIVITAMIN WITH MINERALS) TABS Take 2 tablets by mouth every morning.    Marland Kitchen omeprazole (PRILOSEC) 40 MG capsule TAKE 1 CAPSULE (40 MG TOTAL) BY MOUTH DAILY. 30 capsule 11   No current facility-administered medications on file prior to visit.    No Known Allergies Social History   Social History  . Marital status: Married    Spouse name: N/A  . Number of children: N/A  . Years of education: N/A   Occupational History  . Not on file.   Social History  Main Topics  . Smoking status: Never Smoker  . Smokeless tobacco: Never Used  . Alcohol use Yes     Comment: occasionally  . Drug use: No  . Sexual activity: Not on file   Other Topics Concern  . Not on file   Social History Narrative  . No narrative on file   No family history on file.    Review of Systems  All other systems reviewed and are negative.      Objective:   Physical Exam  Constitutional: He is oriented to person, place, and time. He appears well-developed and well-nourished. No distress.  HENT:  Head: Normocephalic and atraumatic.  Right Ear: External ear normal.  Left Ear: External ear normal.  Nose: Nose normal.  Mouth/Throat: Oropharynx is clear and moist. No oropharyngeal exudate.  Eyes: Conjunctivae and EOM are normal. Pupils are equal, round, and reactive to light. Right eye exhibits no discharge. Left eye exhibits no discharge. No scleral icterus.  Neck: Normal range of motion. Neck supple. No JVD present. No tracheal deviation present. No thyromegaly present.  Cardiovascular: Normal rate, regular rhythm, normal heart sounds and intact distal pulses.  Exam reveals no gallop and no friction rub.   No murmur heard. Pulmonary/Chest: Effort normal and breath sounds normal. No stridor. No respiratory distress. He has no wheezes. He has no rales. He exhibits no tenderness.  Abdominal: Soft. Bowel sounds are normal. He exhibits no distension and no mass. There is no tenderness. There is no rebound and no guarding.  Musculoskeletal: Normal range of motion. He exhibits no edema or tenderness.  Lymphadenopathy:    He has no cervical adenopathy.  Neurological: He is alert and oriented to person, place, and time. He has normal reflexes. No cranial nerve deficit. He exhibits normal muscle tone. Coordination normal.  Skin: Skin is warm. No rash noted. He is not diaphoretic. No erythema. No pallor.  Psychiatric: He has a normal mood and affect. His behavior is normal.  Judgment and thought content normal.  Vitals reviewed.         Assessment & Plan:  Routine general medical examination at a health care facility  Benign essential HTN  Class 1 obesity due to excess calories without serious comorbidity in adult, unspecified BMI  Recommended 30 minutes of aerobic exercise 5 days a week. This includes running, jogging, walking on a treadmill, or other moderate strenuous activities. He also needs to restrict his calories to  1500 cal per day. We discussed starting him on saxenda for weight loss. He will research his copay to determine if he can afford the medication prior to starting it. I recommended against using phentermine again because of his age and high blood pressure. The remainder of his lab work is excellent. His ASCVD card he vascular risk estimate is 3.3% the next 10 years and therefore he does not require statin. However I did recommend a low saturated fat diet high in fruits and vegetables as well as 25-50 pounds weight loss to help address his fatty liver disease. Refill the patient's blood pressure medication along with his Viagra.

## 2016-07-04 ENCOUNTER — Ambulatory Visit (INDEPENDENT_AMBULATORY_CARE_PROVIDER_SITE_OTHER): Payer: BLUE CROSS/BLUE SHIELD | Admitting: Family Medicine

## 2016-07-04 ENCOUNTER — Encounter: Payer: Self-pay | Admitting: Family Medicine

## 2016-07-04 VITALS — BP 130/76 | HR 82 | Temp 99.7°F | Resp 14 | Ht 72.0 in | Wt 264.0 lb

## 2016-07-04 DIAGNOSIS — J029 Acute pharyngitis, unspecified: Secondary | ICD-10-CM

## 2016-07-04 LAB — STREP GROUP A AG, W/REFLEX TO CULT: STREGTOCOCCUS GROUP A AG SCREEN: NOT DETECTED

## 2016-07-04 MED ORDER — AMOXICILLIN 500 MG PO CAPS: 500.0000 mg | ORAL_CAPSULE | Freq: Two times a day (BID) | ORAL | 0 refills | Status: DC

## 2016-07-04 NOTE — Patient Instructions (Signed)
F/U as needed

## 2016-07-04 NOTE — Progress Notes (Signed)
   Subjective:    Patient ID: Travis Sanders, male    DOB: 1971-03-08, 46 y.o.   MRN: [id number]  Patient presents for Illness (x1 week- sore throat, ear pain)   Sore throat for 1 week, pain with swallowing worse on left side,  left ear pain, pain with eating Noticed some swelling of his nails.  Does not feel like typical reflux  No allergy symptoms   No cough, does not feel bad besides yesterday had  No fever at home  Has taken OTC tylenol, no throat sprays    Review Of Systems:  GEN- denies fatigue, fever, weight loss,weakness, recent illness HEENT- denies eye drainage, change in vision, nasal discharge, CVS- denies chest pain, palpitations RESP- denies SOB, cough, wheeze ABD- denies N/V, change in stools, abd pain GU- denies dysuria, hematuria, dribbling, incontinence MSK- denies joint pain, muscle aches, injury Neuro- denies headache, dizziness, syncope, seizure activity       Objective:    BP 130/76   Pulse 82   Temp 99.7 F (37.6 C) (Oral)   Resp 14   Ht 6' (1.829 m)   Wt 264 lb (119.7 kg)   SpO2 98%   BMI 35.80 kg/m  GEN- NAD, alert and oriented x3 HEENT- PERRL, EOMI, non injected sclera, pink conjunctiva, MMM, oropharynx+ injection, TM clear bilat no effusion,  Nares clear  Neck- Supple, + LAD, TTP left side  CVS- RRR, no murmur RESP-CTAB Pulses- Radial 2+          Assessment & Plan:      Problem List Items Addressed This Visit    None    Visit Diagnoses    Acute pharyngitis, unspecified etiology    -  Primary   Clinical bacterial phargynitis, 1 week of symptoms and progressive, salt water gargle, NSAID, AMox x 7 days, throat culture sent.    Relevant Orders   STREP GROUP A AG, W/REFLEX TO CULT      Note: This dictation was prepared with Dragon dictation along with smaller phrase technology. Any transcriptional errors that result from this process are unintentional.

## 2016-07-06 LAB — CULTURE, GROUP A STREP

## 2017-02-13 ENCOUNTER — Other Ambulatory Visit: Payer: Self-pay | Admitting: Family Medicine

## 2017-02-28 ENCOUNTER — Other Ambulatory Visit: Payer: Self-pay | Admitting: Family Medicine

## 2017-02-28 DIAGNOSIS — E785 Hyperlipidemia, unspecified: Secondary | ICD-10-CM

## 2017-02-28 DIAGNOSIS — Z0000 Encounter for general adult medical examination without abnormal findings: Secondary | ICD-10-CM

## 2017-02-28 DIAGNOSIS — Z79899 Other long term (current) drug therapy: Secondary | ICD-10-CM

## 2017-02-28 DIAGNOSIS — I10 Essential (primary) hypertension: Secondary | ICD-10-CM

## 2017-03-01 ENCOUNTER — Other Ambulatory Visit: Payer: BLUE CROSS/BLUE SHIELD

## 2017-03-01 DIAGNOSIS — I10 Essential (primary) hypertension: Secondary | ICD-10-CM

## 2017-03-01 DIAGNOSIS — Z79899 Other long term (current) drug therapy: Secondary | ICD-10-CM

## 2017-03-01 DIAGNOSIS — Z0000 Encounter for general adult medical examination without abnormal findings: Secondary | ICD-10-CM

## 2017-03-01 DIAGNOSIS — E785 Hyperlipidemia, unspecified: Secondary | ICD-10-CM

## 2017-03-01 LAB — COMPLETE METABOLIC PANEL WITH GFR
AG RATIO: 1.7 (calc) (ref 1.0–2.5)
ALKALINE PHOSPHATASE (APISO): 96 U/L (ref 40–115)
ALT: 55 U/L — AB (ref 9–46)
AST: 38 U/L (ref 10–40)
Albumin: 3.9 g/dL (ref 3.6–5.1)
BILIRUBIN TOTAL: 0.3 mg/dL (ref 0.2–1.2)
BUN/Creatinine Ratio: 24 (calc) — ABNORMAL HIGH (ref 6–22)
BUN: 26 mg/dL — ABNORMAL HIGH (ref 7–25)
CHLORIDE: 109 mmol/L (ref 98–110)
CO2: 28 mmol/L (ref 20–32)
Calcium: 9 mg/dL (ref 8.6–10.3)
Creat: 1.07 mg/dL (ref 0.60–1.35)
GFR, Est African American: 95 mL/min/{1.73_m2} (ref >=60)
GFR, Est Non African American: 82 mL/min/{1.73_m2} (ref >=60)
GLUCOSE: 103 mg/dL — AB (ref 65–99)
Globulin: 2.3 g/dL (calc) (ref 1.9–3.7)
POTASSIUM: 4.9 mmol/L (ref 3.5–5.3)
Sodium: 143 mmol/L (ref 135–146)
Total Protein: 6.2 g/dL (ref 6.1–8.1)

## 2017-03-01 LAB — LIPID PANEL
CHOLESTEROL: 192 mg/dL (ref <200)
HDL: 47 mg/dL (ref >40)
LDL Cholesterol (Calc): 130 mg/dL (calc) — ABNORMAL HIGH
Non-HDL Cholesterol (Calc): 145 mg/dL (calc) — ABNORMAL HIGH (ref <130)
TRIGLYCERIDES: 61 mg/dL (ref <150)
Total CHOL/HDL Ratio: 4.1 (calc) (ref <5.0)

## 2017-03-01 LAB — CBC WITH DIFFERENTIAL/PLATELET
BASOS ABS: 59 {cells}/uL (ref 0–200)
Basophils Relative: 1 %
EOS ABS: 183 {cells}/uL (ref 15–500)
Eosinophils Relative: 3.1 %
HEMATOCRIT: 40.5 % (ref 38.5–50.0)
Hemoglobin: 13.7 g/dL (ref 13.2–17.1)
LYMPHS ABS: 1805 {cells}/uL (ref 850–3900)
MCH: 31.4 pg (ref 27.0–33.0)
MCHC: 33.8 g/dL (ref 32.0–36.0)
MCV: 92.7 fL (ref 80.0–100.0)
MPV: 9.8 fL (ref 7.5–12.5)
Monocytes Relative: 6.8 %
NEUTROS PCT: 58.5 %
Neutro Abs: 3452 cells/uL (ref 1500–7800)
Platelets: 284 10*3/uL (ref 140–400)
RBC: 4.37 10*6/uL (ref 4.20–5.80)
RDW: 12.8 % (ref 11.0–15.0)
Total Lymphocyte: 30.6 %
WBC: 5.9 10*3/uL (ref 3.8–10.8)
WBCMIX: 401 {cells}/uL (ref 200–950)

## 2017-03-05 ENCOUNTER — Ambulatory Visit (INDEPENDENT_AMBULATORY_CARE_PROVIDER_SITE_OTHER): Payer: BLUE CROSS/BLUE SHIELD | Admitting: Family Medicine

## 2017-03-05 VITALS — BP 140/82 | HR 64 | Temp 98.2°F | Resp 16 | Ht 72.0 in | Wt 264.0 lb

## 2017-03-05 DIAGNOSIS — I10 Essential (primary) hypertension: Secondary | ICD-10-CM

## 2017-03-05 DIAGNOSIS — Z0000 Encounter for general adult medical examination without abnormal findings: Secondary | ICD-10-CM

## 2017-03-05 DIAGNOSIS — R1032 Left lower quadrant pain: Secondary | ICD-10-CM

## 2017-03-05 DIAGNOSIS — E6609 Other obesity due to excess calories: Secondary | ICD-10-CM

## 2017-03-05 MED ORDER — LOSARTAN POTASSIUM 100 MG PO TABS: 100.0000 mg | ORAL_TABLET | Freq: Every day | ORAL | 3 refills | Status: DC

## 2017-03-05 MED ORDER — OMEPRAZOLE 40 MG PO CPDR: 40.0000 mg | DELAYED_RELEASE_CAPSULE | Freq: Every day | ORAL | 3 refills | Status: DC

## 2017-03-05 NOTE — Progress Notes (Signed)
Subjective:    Patient ID: Travis Sanders, male    DOB: 1970/09/09, 46 y.o.   MRN: [id number]  HPI Patient is here today for complete physical exam. Patient states he has had pain in hte inguinal canal ever since April.  Denies any specific injury but pain is made worse when he strains to lift heavy objects.  Denies a bulge in the inguinal canal on the left side.  No palpable mass with valsalva.  No palpable hernia on exam.  Denies melena or hematochezia.  Denies hematuria or dysuria.  Denies constipation.  Denies change in bowel habits.  Most recent labs are listed below.   Appointment on 03/01/2017  Component Date Value Ref Range Status  . Glucose, Bld 03/01/2017 103* 65 - 99 mg/dL Final   Comment: .            Fasting reference interval . For someone without known diabetes, a glucose value between 100 and 125 mg/dL is consistent with prediabetes and should be confirmed with a follow-up test. .   . BUN 03/01/2017 26* 7 - 25 mg/dL Final  . Creat 03/01/2017 1.07  0.60 - 1.35 mg/dL Final  . GFR, Est Non African American 03/01/2017 82  > OR = 60 mL/min/1.16m2 Final  . GFR, Est African American 03/01/2017 95  > OR = 60 mL/min/1.64m2 Final  . BUN/Creatinine Ratio 03/01/2017 24* 6 - 22 (calc) Final  . Sodium 03/01/2017 143  135 - 146 mmol/L Final  . Potassium 03/01/2017 4.9  3.5 - 5.3 mmol/L Final  . Chloride 03/01/2017 109  98 - 110 mmol/L Final  . CO2 03/01/2017 28  20 - 32 mmol/L Final  . Calcium 03/01/2017 9.0  8.6 - 10.3 mg/dL Final  . Total Protein 03/01/2017 6.2  6.1 - 8.1 g/dL Final  . Albumin 03/01/2017 3.9  3.6 - 5.1 g/dL Final  . Globulin 03/01/2017 2.3  1.9 - 3.7 g/dL (calc) Final  . AG Ratio 03/01/2017 1.7  1.0 - 2.5 (calc) Final  . Total Bilirubin 03/01/2017 0.3  0.2 - 1.2 mg/dL Final  . Alkaline phosphatase (APISO) 03/01/2017 96  40 - 115 U/L Final  . AST 03/01/2017 38  10 - 40 U/L Final  . ALT 03/01/2017 55* 9 - 46 U/L Final  . Cholesterol 03/01/2017 192  <200  mg/dL Final  . HDL 03/01/2017 47  >40 mg/dL Final  . Triglycerides 03/01/2017 61  <150 mg/dL Final  . LDL Cholesterol (Calc) 03/01/2017 130* mg/dL (calc) Final   Comment: Reference range: <100 . Desirable range <100 mg/dL for primary prevention;   <70 mg/dL for patients with CHD or diabetic patients  with > or = 2 CHD risk factors. Marland Kitchen LDL-C is now calculated using the Martin-Hopkins  calculation, which is a validated novel method providing  better accuracy than the Friedewald equation in the  estimation of LDL-C.  Cresenciano Genre et al. Annamaria Helling. 0240;973(53): 2061-2068  (http://education.[facility].com/faq/FAQ164)   . Total CHOL/HDL Ratio 03/01/2017 4.1  <5.0 (calc) Final  . Non-HDL Cholesterol (Calc) 03/01/2017 145* <130 mg/dL (calc) Final   Comment: For patients with diabetes plus 1 major ASCVD risk  factor, treating to a non-HDL-C goal of <100 mg/dL  (LDL-C of <70 mg/dL) is considered a therapeutic  option.   . WBC 03/01/2017 5.9  3.8 - 10.8 Thousand/uL Final  . RBC 03/01/2017 4.37  4.20 - 5.80 Million/uL Final  . Hemoglobin 03/01/2017 13.7  13.2 - 17.1 g/dL Final  . HCT 03/01/2017 40.5  38.5 - 50.0 % Final  . MCV 03/01/2017 92.7  80.0 - 100.0 fL Final  . MCH 03/01/2017 31.4  27.0 - 33.0 pg Final  . MCHC 03/01/2017 33.8  32.0 - 36.0 g/dL Final  . RDW 03/01/2017 12.8  11.0 - 15.0 % Final  . Platelets 03/01/2017 284  140 - 400 Thousand/uL Final  . MPV 03/01/2017 9.8  7.5 - 12.5 fL Final  . Neutro Abs 03/01/2017 3,452  1,500 - 7,800 cells/uL Final  . Lymphs Abs 03/01/2017 1,805  850 - 3,900 cells/uL Final  . WBC mixed population 03/01/2017 401  200 - 950 cells/uL Final  . Eosinophils Absolute 03/01/2017 183  15 - 500 cells/uL Final  . Basophils Absolute 03/01/2017 59  0 - 200 cells/uL Final  . Neutrophils Relative % 03/01/2017 58.5  % Final  . Total Lymphocyte 03/01/2017 30.6  % Final  . Monocytes Relative 03/01/2017 6.8  % Final  . Eosinophils Relative 03/01/2017 3.1  % Final    . Basophils Relative 03/01/2017 1.0  % Final   Past Medical History:  Diagnosis Date  . GERD (gastroesophageal reflux disease)   . Hyperlipidemia   . Hypertension    Past Surgical History:  Procedure Laterality Date  . KNEE ARTHROSCOPY    . TONSILLECTOMY     Current Outpatient Medications on File Prior to Visit  Medication Sig Dispense Refill  . Multiple Vitamin (MULTIVITAMIN WITH MINERALS) TABS Take 2 tablets by mouth every morning.    . sildenafil (VIAGRA) 100 MG tablet Take 1 tablet (100 mg total) by mouth daily as needed. 24 tablet 3   No current facility-administered medications on file prior to visit.    No Known Allergies Social History   Socioeconomic History  . Marital status: Married    Spouse name: Not on file  . Number of children: Not on file  . Years of education: Not on file  . Highest education level: Not on file  Social Needs  . Financial resource strain: Not on file  . Food insecurity - worry: Not on file  . Food insecurity - inability: Not on file  . Transportation needs - medical: Not on file  . Transportation needs - non-medical: Not on file  Occupational History  . Not on file  Tobacco Use  . Smoking status: Never Smoker  . Smokeless tobacco: Never Used  Substance and Sexual Activity  . Alcohol use: Yes    Comment: occasionally  . Drug use: No  . Sexual activity: Yes  Other Topics Concern  . Not on file  Social History Narrative  . Not on file   No family history on file.    Review of Systems  All other systems reviewed and are negative.      Objective:   Physical Exam  Constitutional: He is oriented to person, place, and time. He appears well-developed and well-nourished. No distress.  HENT:  Head: Normocephalic and atraumatic.  Right Ear: External ear normal.  Left Ear: External ear normal.  Nose: Nose normal.  Mouth/Throat: Oropharynx is clear and moist. No oropharyngeal exudate.  Eyes: Conjunctivae and EOM are normal.  Pupils are equal, round, and reactive to light. Right eye exhibits no discharge. Left eye exhibits no discharge. No scleral icterus.  Neck: Normal range of motion. Neck supple. No JVD present. No tracheal deviation present. No thyromegaly present.  Cardiovascular: Normal rate, regular rhythm, normal heart sounds and intact distal pulses. Exam reveals no gallop and no friction rub.  No  murmur heard. Pulmonary/Chest: Effort normal and breath sounds normal. No stridor. No respiratory distress. He has no wheezes. He has no rales. He exhibits no tenderness.  Abdominal: Soft. Bowel sounds are normal. He exhibits no distension and no mass. There is no tenderness. There is no rebound and no guarding. Hernia confirmed negative in the right inguinal area and confirmed negative in the left inguinal area.  Genitourinary: Testes normal and penis normal. Cremasteric reflex is present. Right testis shows no mass, no swelling and no tenderness. Left testis shows no mass, no swelling and no tenderness.  Musculoskeletal: Normal range of motion. He exhibits no edema or tenderness.  Lymphadenopathy:    He has no cervical adenopathy.       Right: No inguinal adenopathy present.       Left: No inguinal adenopathy present.  Neurological: He is alert and oriented to person, place, and time. He has normal reflexes. No cranial nerve deficit. He exhibits normal muscle tone. Coordination normal.  Skin: Skin is warm. No rash noted. He is not diaphoretic. No erythema. No pallor.  Psychiatric: He has a normal mood and affect. His behavior is normal. Judgment and thought content normal.  Vitals reviewed.         Assessment & Plan:  Routine general medical examination at a health care facility  Benign essential HTN  Class 1 obesity due to excess calories without serious comorbidity in adult, unspecified BMI  Patient's blood pressure today is borderline. His weight is elevated. I have recommended low carbohydrate diet,  low saturated fat diet, and aggressive weight loss to address his elevated fasting blood sugar as well as his elevated cholesterol and improve his blood pressure. Immunizations are up-to-date. Cancer screening is not yet due. His history is concerning for an inguinal hernia although his exam is negative for an obvious hernia. Therefore I will schedule the patient for a CT scan to verify a small inguinal hernia amenable to surgical correction versus a different underlying cause of his inguinal canal pain

## 2017-03-06 ENCOUNTER — Other Ambulatory Visit: Payer: Self-pay | Admitting: Family Medicine

## 2017-03-06 DIAGNOSIS — R1032 Left lower quadrant pain: Secondary | ICD-10-CM

## 2017-03-15 ENCOUNTER — Ambulatory Visit (HOSPITAL_COMMUNITY): Payer: BLUE CROSS/BLUE SHIELD

## 2017-03-21 ENCOUNTER — Ambulatory Visit (HOSPITAL_COMMUNITY)
Admission: RE | Admit: 2017-03-21 | Discharge: 2017-03-21 | Disposition: A | Discharge status: Home / Self Care | Payer: BLUE CROSS/BLUE SHIELD | Source: Ambulatory Visit | Attending: Family Medicine | Admitting: Family Medicine

## 2017-03-21 DIAGNOSIS — R1032 Left lower quadrant pain: Secondary | ICD-10-CM | POA: Insufficient documentation

## 2017-03-21 MED ORDER — IOPAMIDOL (ISOVUE-300) INJECTION 61%
100.0000 mL | Freq: Once | INTRAVENOUS | Status: AC | PRN
  Administered 2017-03-21: 100 mL via INTRAVENOUS

## 2017-03-22 ENCOUNTER — Encounter: Payer: Self-pay | Admitting: Family Medicine

## 2017-09-03 ENCOUNTER — Other Ambulatory Visit: Payer: Self-pay | Admitting: Family Medicine

## 2017-09-03 MED ORDER — SILDENAFIL CITRATE 100 MG PO TABS: 100.0000 mg | ORAL_TABLET | Freq: Every day | ORAL | 11 refills | Status: DC | PRN

## 2018-03-08 ENCOUNTER — Other Ambulatory Visit: Payer: Self-pay | Admitting: Family Medicine

## 2018-04-28 ENCOUNTER — Encounter: Payer: Self-pay | Admitting: Family Medicine

## 2018-04-28 ENCOUNTER — Ambulatory Visit: Payer: Self-pay | Admitting: Family Medicine

## 2018-04-28 VITALS — BP 156/104 | HR 80 | Temp 98.4°F | Resp 16 | Ht 72.0 in | Wt 266.0 lb

## 2018-04-28 DIAGNOSIS — I10 Essential (primary) hypertension: Secondary | ICD-10-CM

## 2018-04-28 DIAGNOSIS — R1032 Left lower quadrant pain: Secondary | ICD-10-CM

## 2018-04-28 DIAGNOSIS — J320 Chronic maxillary sinusitis: Secondary | ICD-10-CM

## 2018-04-28 DIAGNOSIS — E785 Hyperlipidemia, unspecified: Secondary | ICD-10-CM

## 2018-04-28 MED ORDER — PREDNISONE 20 MG PO TABS: ORAL_TABLET | ORAL | 0 refills | Status: DC

## 2018-04-28 MED ORDER — OMEPRAZOLE 40 MG PO CPDR: 40.0000 mg | DELAYED_RELEASE_CAPSULE | Freq: Every day | ORAL | 3 refills | Status: DC

## 2018-04-28 MED ORDER — HYDROCHLOROTHIAZIDE 25 MG PO TABS: 25.0000 mg | ORAL_TABLET | Freq: Every day | ORAL | 3 refills | Status: DC

## 2018-04-28 MED ORDER — LOSARTAN POTASSIUM 100 MG PO TABS: 100.0000 mg | ORAL_TABLET | Freq: Every day | ORAL | 3 refills | Status: DC

## 2018-04-28 MED ORDER — LEVOCETIRIZINE DIHYDROCHLORIDE 5 MG PO TABS: 5.0000 mg | ORAL_TABLET | Freq: Every evening | ORAL | 0 refills | Status: DC

## 2018-04-28 NOTE — Progress Notes (Signed)
Subjective:    Patient ID: Travis Sanders, male    DOB: February 18, 1971, 48 y.o.   MRN: [id number]  HPI   Patient is here today to get a refill on his medication.  He has not been seen since 2018 however he is still taking his losartan.  His blood pressure is elevated today at 156/104.  However since that time, the patient has recently been laid off and also went through a divorce.  He is not resting very well.  He denies any chest pain or shortness of breath.  Over the last few months, he reports constant pressure in both maxillary sinuses.  He reports intermittent plugging and stopping of his left ear suggesting eustachian tube dysfunction.  He is tried Flonase with no relief.  He is tried Afrin with no relief.  He denies any pain in his maxillary sinuses.  He denies any fever.  He does report postnasal drip and occasional episodes of coughing.  These come and go over the last few months.  He is not taking any sinus medication regularly.  He continues to report pain in his left lower quadrant.  Please see previous office visit and at that time we did a CT scan which ruled out an inguinal hernia.  The pain is located near the inguinal canal over the hip joint.  The pain is made worse by movement.  He has normal flexion and extension of his hip joint.  He has normal internal and external rotation of the hip joint without pain.  However movement seems to trigger the pain when he is bearing weight suggesting a musculoskeletal etiology.  He does report occasional changes in his stool caliber however.  Sometimes one side will be flattened.  He denies any melena or hematochezia.  Rectal exam was performed today and does show an enlarged prostate however there are no rectal masses appreciated patient states the stool fluctuates.  It does not always happen.  Quite often is normal.  However I strongly recommended that the patient have a colonoscopy to evaluate this further.  The CT scan is good but it cannot rule out  intraluminal masses within the colon.  I still believe the pain in his left lower quadrant is likely musculoskeletal however given his age and change in stool caliber I would recommend a GI consult.  Patient declines this at the present time as he has no insurance. Past Medical History:  Diagnosis Date  . GERD (gastroesophageal reflux disease)   . Hyperlipidemia   . Hypertension    Past Surgical History:  Procedure Laterality Date  . KNEE ARTHROSCOPY    . TONSILLECTOMY     Current Outpatient Medications on File Prior to Visit  Medication Sig Dispense Refill  . losartan (COZAAR) 100 MG tablet TAKE 1 TABLET BY MOUTH EVERY DAY 90 tablet 0  . omeprazole (PRILOSEC) 40 MG capsule Take 1 capsule (40 mg total) by mouth daily. 90 capsule 3   No current facility-administered medications on file prior to visit.    No Known Allergies Social History   Socioeconomic History  . Marital status: Married    Spouse name: Not on file  . Number of children: Not on file  . Years of education: Not on file  . Highest education level: Not on file  Occupational History  . Not on file  Social Needs  . Financial resource strain: Not on file  . Food insecurity:    Worry: Not on file    Inability:  Not on file  . Transportation needs:    Medical: Not on file    Non-medical: Not on file  Tobacco Use  . Smoking status: Never Smoker  . Smokeless tobacco: Never Used  Substance and Sexual Activity  . Alcohol use: Yes    Comment: occasionally  . Drug use: No  . Sexual activity: Yes  Lifestyle  . Physical activity:    Days per week: Not on file    Minutes per session: Not on file  . Stress: Not on file  Relationships  . Social connections:    Talks on phone: Not on file    Gets together: Not on file    Attends religious service: Not on file    Active member of club or organization: Not on file    Attends meetings of clubs or organizations: Not on file    Relationship status: Not on file  .  Intimate partner violence:    Fear of current or ex partner: Not on file    Emotionally abused: Not on file    Physically abused: Not on file    Forced sexual activity: Not on file  Other Topics Concern  . Not on file  Social History Narrative  . Not on file   No family history on file.    Review of Systems  All other systems reviewed and are negative.      Objective:   Physical Exam  Constitutional: He is oriented to person, place, and time. He appears well-developed and well-nourished. No distress.  HENT:  Head: Normocephalic and atraumatic.  Right Ear: External ear normal.  Left Ear: External ear normal.  Nose: Nose normal.  Mouth/Throat: Oropharynx is clear and moist. No oropharyngeal exudate.  Eyes: Pupils are equal, round, and reactive to light. Conjunctivae and EOM are normal. Right eye exhibits no discharge. Left eye exhibits no discharge. No scleral icterus.  Neck: Normal range of motion. Neck supple. No JVD present. No tracheal deviation present. No thyromegaly present.  Cardiovascular: Normal rate, regular rhythm, normal heart sounds and intact distal pulses. Exam reveals no gallop and no friction rub.  No murmur heard. Pulmonary/Chest: Effort normal and breath sounds normal. No stridor. No respiratory distress. He has no wheezes. He has no rales. He exhibits no tenderness.  Abdominal: Soft. Bowel sounds are normal. He exhibits no distension and no mass. There is no abdominal tenderness. There is no rebound and no guarding. Hernia confirmed negative in the right inguinal area and confirmed negative in the left inguinal area.  Genitourinary:    Rectum normal.  Prostate is enlarged.  Musculoskeletal: Normal range of motion.        General: No tenderness or edema.  Lymphadenopathy:    He has no cervical adenopathy.       Right: No inguinal adenopathy present.       Left: No inguinal adenopathy present.  Neurological: He is alert and oriented to person, place, and  time. He has normal reflexes. No cranial nerve deficit. He exhibits normal muscle tone. Coordination normal.  Skin: Skin is warm. No rash noted. He is not diaphoretic. No erythema. No pallor.  Psychiatric: He has a normal mood and affect. His behavior is normal. Judgment and thought content normal.  Vitals reviewed.         Assessment & Plan:  Hypertension, add hydrochlorothiazide 25 mg a day to losartan and recheck blood pressure in 1 month.  Check CMP and fasting lipid panel.  Chronic sinusitis.  I  do not believe this is an infection but rather inflammation and irritation.  Therefore I recommended adding Xyzal 5 mg a day after a prednisone taper pack.    Left inguinal pain.  CT scan was negative.  I have recommended a GI consultation for colonoscopy.  Patient will contact me when he wants me to schedule this as he wants to arrange insurance first.  I believe most likely the pain is musculoskeletal given the fact is made worse by standing and movement however I would recommend a colonoscopy given the fact the shape of the stool has changed.

## 2018-04-29 LAB — COMPLETE METABOLIC PANEL WITH GFR
AG Ratio: 1.6 (calc) (ref 1.0–2.5)
ALT: 39 U/L (ref 9–46)
AST: 20 U/L (ref 10–40)
Albumin: 4.6 g/dL (ref 3.6–5.1)
Alkaline phosphatase (APISO): 113 U/L (ref 36–130)
BUN: 23 mg/dL (ref 7–25)
CO2: 29 mmol/L (ref 20–32)
CREATININE: 1.16 mg/dL (ref 0.60–1.35)
Calcium: 10.2 mg/dL (ref 8.6–10.3)
Chloride: 103 mmol/L (ref 98–110)
GFR, EST NON AFRICAN AMERICAN: 74 mL/min/{1.73_m2} (ref >=60)
GFR, Est African American: 85 mL/min/{1.73_m2} (ref >=60)
Globulin: 2.9 g/dL (calc) (ref 1.9–3.7)
Glucose, Bld: 94 mg/dL (ref 65–99)
Potassium: 4.3 mmol/L (ref 3.5–5.3)
Sodium: 141 mmol/L (ref 135–146)
Total Bilirubin: 0.5 mg/dL (ref 0.2–1.2)
Total Protein: 7.5 g/dL (ref 6.1–8.1)

## 2018-04-29 LAB — LIPID PANEL
CHOL/HDL RATIO: 4.7 (calc) (ref <5.0)
Cholesterol: 243 mg/dL — ABNORMAL HIGH (ref <200)
HDL: 52 mg/dL (ref >=40)
LDL Cholesterol (Calc): 166 mg/dL (calc) — ABNORMAL HIGH
NON-HDL CHOLESTEROL (CALC): 191 mg/dL — AB (ref <130)
TRIGLYCERIDES: 122 mg/dL (ref <150)

## 2018-04-30 ENCOUNTER — Other Ambulatory Visit: Payer: Self-pay | Admitting: Family Medicine

## 2018-04-30 DIAGNOSIS — E785 Hyperlipidemia, unspecified: Secondary | ICD-10-CM

## 2018-04-30 DIAGNOSIS — Z79899 Other long term (current) drug therapy: Secondary | ICD-10-CM

## 2018-04-30 MED ORDER — ATORVASTATIN CALCIUM 20 MG PO TABS: 20.0000 mg | ORAL_TABLET | Freq: Every day | ORAL | 1 refills | Status: DC

## 2018-05-08 ENCOUNTER — Other Ambulatory Visit: Payer: Self-pay | Admitting: Family Medicine

## 2018-06-09 ENCOUNTER — Other Ambulatory Visit: Payer: Self-pay | Admitting: Family Medicine

## 2018-06-09 MED ORDER — LOSARTAN POTASSIUM 100 MG PO TABS: 100.0000 mg | ORAL_TABLET | Freq: Every day | ORAL | 3 refills | Status: DC

## 2019-03-25 ENCOUNTER — Ambulatory Visit: Payer: BLUE CROSS/BLUE SHIELD | Attending: Internal Medicine

## 2019-06-22 ENCOUNTER — Other Ambulatory Visit: Payer: Self-pay

## 2019-06-22 MED ORDER — LOSARTAN POTASSIUM 100 MG PO TABS: 100.0000 mg | ORAL_TABLET | Freq: Every day | ORAL | 0 refills | Status: DC

## 2019-06-22 MED ORDER — OMEPRAZOLE 40 MG PO CPDR: DELAYED_RELEASE_CAPSULE | ORAL | 0 refills | Status: DC

## 2019-07-01 ENCOUNTER — Other Ambulatory Visit: Payer: Self-pay

## 2019-07-01 DIAGNOSIS — Z0000 Encounter for general adult medical examination without abnormal findings: Secondary | ICD-10-CM

## 2019-07-02 LAB — COMPREHENSIVE METABOLIC PANEL
AG Ratio: 1.8 (calc) (ref 1.0–2.5)
ALT: 44 U/L (ref 9–46)
AST: 26 U/L (ref 10–35)
Albumin: 4.4 g/dL (ref 3.6–5.1)
Alkaline phosphatase (APISO): 110 U/L (ref 35–144)
BUN: 21 mg/dL (ref 7–25)
CO2: 29 mmol/L (ref 20–32)
Calcium: 9.3 mg/dL (ref 8.6–10.3)
Chloride: 105 mmol/L (ref 98–110)
Creat: 1.03 mg/dL (ref 0.70–1.33)
Globulin: 2.4 g/dL (calc) (ref 1.9–3.7)
Glucose, Bld: 108 mg/dL — ABNORMAL HIGH (ref 65–99)
Potassium: 4.8 mmol/L (ref 3.5–5.3)
Sodium: 141 mmol/L (ref 135–146)
Total Bilirubin: 0.4 mg/dL (ref 0.2–1.2)
Total Protein: 6.8 g/dL (ref 6.1–8.1)

## 2019-07-02 LAB — CBC WITH DIFFERENTIAL/PLATELET
Absolute Monocytes: 336 cells/uL (ref 200–950)
Basophils Absolute: 38 cells/uL (ref 0–200)
Basophils Relative: 0.8 %
Eosinophils Absolute: 139 cells/uL (ref 15–500)
Eosinophils Relative: 2.9 %
HCT: 43.5 % (ref 38.5–50.0)
Hemoglobin: 14.6 g/dL (ref 13.2–17.1)
Lymphs Abs: 1589 cells/uL (ref 850–3900)
MCH: 31.5 pg (ref 27.0–33.0)
MCHC: 33.6 g/dL (ref 32.0–36.0)
MCV: 94 fL (ref 80.0–100.0)
MPV: 10 fL (ref 7.5–12.5)
Monocytes Relative: 7 %
Neutro Abs: 2698 cells/uL (ref 1500–7800)
Neutrophils Relative %: 56.2 %
Platelets: 269 10*3/uL (ref 140–400)
RBC: 4.63 10*6/uL (ref 4.20–5.80)
RDW: 12.9 % (ref 11.0–15.0)
Total Lymphocyte: 33.1 %
WBC: 4.8 10*3/uL (ref 3.8–10.8)

## 2019-07-02 LAB — LIPID PANEL
Cholesterol: 222 mg/dL — ABNORMAL HIGH (ref <200)
HDL: 52 mg/dL (ref >=40)
LDL Cholesterol (Calc): 151 mg/dL (calc) — ABNORMAL HIGH
Non-HDL Cholesterol (Calc): 170 mg/dL (calc) — ABNORMAL HIGH (ref <130)
Total CHOL/HDL Ratio: 4.3 (calc) (ref <5.0)
Triglycerides: 89 mg/dL (ref <150)

## 2019-07-02 LAB — PSA: PSA: 0.5 ng/mL (ref <=4.0)

## 2019-07-03 ENCOUNTER — Encounter (INDEPENDENT_AMBULATORY_CARE_PROVIDER_SITE_OTHER): Payer: Self-pay | Admitting: *Deleted

## 2019-07-03 ENCOUNTER — Ambulatory Visit (INDEPENDENT_AMBULATORY_CARE_PROVIDER_SITE_OTHER): Payer: Managed Care, Other (non HMO) | Admitting: Family Medicine

## 2019-07-03 ENCOUNTER — Encounter: Payer: Self-pay | Admitting: Family Medicine

## 2019-07-03 ENCOUNTER — Other Ambulatory Visit: Payer: Self-pay

## 2019-07-03 VITALS — BP 162/80 | HR 68 | Temp 97.7°F | Resp 16 | Ht 72.0 in | Wt 274.0 lb

## 2019-07-03 DIAGNOSIS — Z0001 Encounter for general adult medical examination with abnormal findings: Secondary | ICD-10-CM

## 2019-07-03 DIAGNOSIS — Z0000 Encounter for general adult medical examination without abnormal findings: Secondary | ICD-10-CM

## 2019-07-03 DIAGNOSIS — I10 Essential (primary) hypertension: Secondary | ICD-10-CM | POA: Diagnosis not present

## 2019-07-03 DIAGNOSIS — E785 Hyperlipidemia, unspecified: Secondary | ICD-10-CM

## 2019-07-03 DIAGNOSIS — Z1211 Encounter for screening for malignant neoplasm of colon: Secondary | ICD-10-CM

## 2019-07-03 DIAGNOSIS — R1032 Left lower quadrant pain: Secondary | ICD-10-CM

## 2019-07-03 MED ORDER — LOSARTAN POTASSIUM 100 MG PO TABS: 100.0000 mg | ORAL_TABLET | Freq: Every day | ORAL | 3 refills | Status: DC

## 2019-07-03 MED ORDER — HYDROCHLOROTHIAZIDE 25 MG PO TABS: 25.0000 mg | ORAL_TABLET | Freq: Every day | ORAL | 3 refills | Status: DC

## 2019-07-03 MED ORDER — ATORVASTATIN CALCIUM 20 MG PO TABS: 20.0000 mg | ORAL_TABLET | Freq: Every day | ORAL | 3 refills | Status: DC

## 2019-07-03 MED ORDER — OMEPRAZOLE 40 MG PO CPDR: DELAYED_RELEASE_CAPSULE | ORAL | 3 refills | Status: DC

## 2019-07-03 NOTE — Progress Notes (Signed)
Subjective:    Patient ID: Travis Sanders, male    DOB: 1971-01-25, 50 y.o.   MRN: [id number]  HPI Patient is a very pleasant 49 year old Caucasian male here today for complete physical exam.  Overall he is doing well.  His blood pressure is elevated today 162/80.  His LDL cholesterol is also significantly elevated at 151.  He is not taking his hydrochlorothiazide as previously prescribed.  He is also not taking atorvastatin.  He denies any chest pain shortness of breath or dyspnea on exertion.  He is concerned about persistent left lower quadrant pain.  This seems to be pain that is musculoskeletal in nature.  In 2019 we performed a CT scan of the abdomen and pelvis which revealed no hernia.  The pain radiates from his lower back around to his left groin.  It is made worse by physical exercise and standing.  He has no palpable hernia on exam.  He denies any hematochezia or melena.  He denies any hematuria.  CT scan was completely normal.  The pain has been there for 3 years and is not worsening but it is not improving.  He has not had a colonoscopy.  Other potential causes could be lower thoracic or upper lumbar radiculopathy.  This is what I am leaning toward suspecting given the chronic nature and absence of findings on his physical exam. Lab on 07/01/2019  Component Date Value Ref Range Status  . Cholesterol 07/01/2019 222* <200 mg/dL Final  . HDL 07/01/2019 52  > OR = 40 mg/dL Final  . Triglycerides 07/01/2019 89  <150 mg/dL Final  . LDL Cholesterol (Calc) 07/01/2019 151* mg/dL (calc) Final   Comment: Reference range: <100 . Desirable range <100 mg/dL for primary prevention;   <70 mg/dL for patients with CHD or diabetic patients  with > or = 2 CHD risk factors. Marland Kitchen LDL-C is now calculated using the Martin-Hopkins  calculation, which is a validated novel method providing  better accuracy than the Friedewald equation in the  estimation of LDL-C.  Cresenciano Genre et al. Annamaria Helling. [id number]):  [id number]  (http://education.[facility].com/faq/FAQ164)   . Total CHOL/HDL Ratio 07/01/2019 4.3  <5.0 (calc) Final  . Non-HDL Cholesterol (Calc) 07/01/2019 170* <130 mg/dL (calc) Final   Comment: For patients with diabetes plus 1 major ASCVD risk  factor, treating to a non-HDL-C goal of <100 mg/dL  (LDL-C of <70 mg/dL) is considered a therapeutic  option.   . Glucose, Bld 07/01/2019 108* 65 - 99 mg/dL Final   Comment: .            Fasting reference interval . For someone without known diabetes, a glucose value between 100 and 125 mg/dL is consistent with prediabetes and should be confirmed with a follow-up test. .   . BUN 07/01/2019 21  7 - 25 mg/dL Final  . Creat 07/01/2019 1.03  0.70 - 1.33 mg/dL Final   Comment: For patients >70 years of age, the reference limit for Creatinine is approximately 13% higher for people identified as African-American. .   Havery Moros Ratio [id number] NOT APPLICABLE  6 - 22 (calc) Final  . Sodium 07/01/2019 141  135 - 146 mmol/L Final  . Potassium 07/01/2019 4.8  3.5 - 5.3 mmol/L Final  . Chloride 07/01/2019 105  98 - 110 mmol/L Final  . CO2 07/01/2019 29  20 - 32 mmol/L Final  . Calcium 07/01/2019 9.3  8.6 - 10.3 mg/dL Final  . Total Protein 07/01/2019 6.8  6.1 - 8.1  g/dL Final  . Albumin 07/01/2019 4.4  3.6 - 5.1 g/dL Final  . Globulin 07/01/2019 2.4  1.9 - 3.7 g/dL (calc) Final  . AG Ratio 07/01/2019 1.8  1.0 - 2.5 (calc) Final  . Total Bilirubin 07/01/2019 0.4  0.2 - 1.2 mg/dL Final  . Alkaline phosphatase (APISO) 07/01/2019 110  35 - 144 U/L Final  . AST 07/01/2019 26  10 - 35 U/L Final  . ALT 07/01/2019 44  9 - 46 U/L Final  . WBC 07/01/2019 4.8  3.8 - 10.8 Thousand/uL Final  . RBC 07/01/2019 4.63  4.20 - 5.80 Million/uL Final  . Hemoglobin 07/01/2019 14.6  13.2 - 17.1 g/dL Final  . HCT 07/01/2019 43.5  38.5 - 50.0 % Final  . MCV 07/01/2019 94.0  80.0 - 100.0 fL Final  . MCH 07/01/2019 31.5  27.0 - 33.0 pg Final  . MCHC  07/01/2019 33.6  32.0 - 36.0 g/dL Final  . RDW 07/01/2019 12.9  11.0 - 15.0 % Final  . Platelets 07/01/2019 269  140 - 400 Thousand/uL Final  . MPV 07/01/2019 10.0  7.5 - 12.5 fL Final  . Neutro Abs 07/01/2019 2,698  1,500 - 7,800 cells/uL Final  . Lymphs Abs 07/01/2019 1,589  850 - 3,900 cells/uL Final  . Absolute Monocytes 07/01/2019 336  200 - 950 cells/uL Final  . Eosinophils Absolute 07/01/2019 139  15 - 500 cells/uL Final  . Basophils Absolute 07/01/2019 38  0 - 200 cells/uL Final  . Neutrophils Relative % 07/01/2019 56.2  % Final  . Total Lymphocyte 07/01/2019 33.1  % Final  . Monocytes Relative 07/01/2019 7.0  % Final  . Eosinophils Relative 07/01/2019 2.9  % Final  . Basophils Relative 07/01/2019 0.8  % Final  . PSA 07/01/2019 0.5  < OR = 4.0 ng/mL Final   Comment: The total PSA value from this assay system is  standardized against the WHO standard. The test  result will be approximately 20% lower when compared  to the equimolar-standardized total PSA (Beckman  Coulter). Comparison of serial PSA results should be  interpreted with this fact in mind. . This test was performed using the Siemens  chemiluminescent method. Values obtained from  different assay methods cannot be used interchangeably. PSA levels, regardless of value, should not be interpreted as absolute evidence of the presence or absence of disease.    Past Medical History:  Diagnosis Date  . GERD (gastroesophageal reflux disease)   . Hyperlipidemia   . Hypertension    Past Surgical History:  Procedure Laterality Date  . KNEE ARTHROSCOPY    . TONSILLECTOMY     Current Outpatient Medications on File Prior to Visit  Medication Sig Dispense Refill  . levocetirizine (XYZAL) 5 MG tablet Take 1 tablet (5 mg total) by mouth every evening. 30 tablet 0  . losartan (COZAAR) 100 MG tablet Take 1 tablet (100 mg total) by mouth daily. 30 tablet 0  . omeprazole (PRILOSEC) 40 MG capsule TAKE 1 CAPSULE BY MOUTH EVERY  DAY 30 capsule 0  . sildenafil (VIAGRA) 100 MG tablet Take 100 mg by mouth daily as needed for erectile dysfunction.     No current facility-administered medications on file prior to visit.   No Known Allergies Social History   Socioeconomic History  . Marital status: Married    Spouse name: Not on file  . Number of children: Not on file  . Years of education: Not on file  . Highest education level: Not on  file  Occupational History  . Not on file  Tobacco Use  . Smoking status: Never Smoker  . Smokeless tobacco: Never Used  Substance and Sexual Activity  . Alcohol use: Yes    Comment: occasionally  . Drug use: No  . Sexual activity: Yes  Other Topics Concern  . Not on file  Social History Narrative  . Not on file   Social Determinants of Health   Financial Resource Strain:   . Difficulty of Paying Living Expenses:   Food Insecurity:   . Worried About Charity fundraiser in the Last Year:   . Arboriculturist in the Last Year:   Transportation Needs:   . Film/video editor (Medical):   Marland Kitchen Lack of Transportation (Non-Medical):   Physical Activity:   . Days of Exercise per Week:   . Minutes of Exercise per Session:   Stress:   . Feeling of Stress :   Social Connections:   . Frequency of Communication with Friends and Family:   . Frequency of Social Gatherings with Friends and Family:   . Attends Religious Services:   . Active Member of Clubs or Organizations:   . Attends Archivist Meetings:   Marland Kitchen Marital Status:   Intimate Partner Violence:   . Fear of Current or Ex-Partner:   . Emotionally Abused:   Marland Kitchen Physically Abused:   . Sexually Abused:    No family history on file.    Review of Systems  All other systems reviewed and are negative.      Objective:   Physical Exam  Constitutional: He is oriented to person, place, and time. He appears well-developed and well-nourished. No distress.  HENT:  Head: Normocephalic and atraumatic.  Right  Ear: External ear normal.  Left Ear: External ear normal.  Nose: Nose normal.  Mouth/Throat: Oropharynx is clear and moist. No oropharyngeal exudate.  Eyes: Pupils are equal, round, and reactive to light. Conjunctivae and EOM are normal. Right eye exhibits no discharge. Left eye exhibits no discharge. No scleral icterus.  Neck: No JVD present. No tracheal deviation present. No thyromegaly present.  Cardiovascular: Normal rate, regular rhythm, normal heart sounds and intact distal pulses. Exam reveals no gallop and no friction rub.  No murmur heard. Pulmonary/Chest: Effort normal and breath sounds normal. No stridor. No respiratory distress. He has no wheezes. He has no rales. He exhibits no tenderness.  Abdominal: Soft. Bowel sounds are normal. He exhibits no distension and no mass. There is no abdominal tenderness. There is no rebound and no guarding.  Musculoskeletal:        General: No tenderness or edema. Normal range of motion.     Cervical back: Normal range of motion and neck supple.  Lymphadenopathy:    He has no cervical adenopathy.  Neurological: He is alert and oriented to person, place, and time. He has normal reflexes. No cranial nerve deficit. He exhibits normal muscle tone. Coordination normal.  Skin: Skin is warm. No rash noted. He is not diaphoretic. No erythema. No pallor.  Psychiatric: He has a normal mood and affect. His behavior is normal. Judgment and thought content normal.  Vitals reviewed.         Assessment & Plan:  Colon cancer screening - Plan: Ambulatory referral to Gastroenterology  Hyperlipidemia, unspecified hyperlipidemia type  Benign essential HTN  Left lower quadrant pain  General medical exam  I will schedule the patient for a colonoscopy to complete his colon cancer screening.  Also given his chronic left lower quadrant abdominal pain I believe this is indicated.  I suspect the most likely cause will be lower thoracic or upper lumbar  radiculopathy given the radiating pattern in a dermatomal distribution in the left lower quadrant as well as a normal CT scan.  His blood pressure is too high.  Add hydrochlorothiazide 25 mg a day and recheck in 1 month.  Cholesterol is also too high.  Add Lipitor 20 mg a day and recheck a CMP and a fasting lipid panel in 3 months.  PSA is outstanding showing no evidence of prostate cancer.  Strongly encourage diet exercise and weight loss.

## 2019-07-03 NOTE — Addendum Note (Signed)
Addended by: Shary Decamp B on: 07/03/2019 10:23 AM   Modules accepted: Orders

## 2019-08-20 IMAGING — CT CT PELVIS W/ CM
2 of 4 series · 17 of 46 positions shown, 19 images · IV contrast (iopamidol)
Comparison: 04/14/2010

CLINICAL DATA: Evaluate for hernia

EXAM:
CT PELVIS WITH CONTRAST
TECHNIQUE: Multidetector CT imaging of the pelvis was performed using the
standard protocol following the bolus administration of intravenous
contrast.
CONTRAST:  100mL ZDUPI8-200 IOPAMIDOL (ZDUPI8-200) INJECTION 61%

[Series 2: axial st · axial · 0.81mm/px · z∈[-579,-294]mm · 14 of 67 slices shown, 16 images]
[im 5/67  soft-tissue]
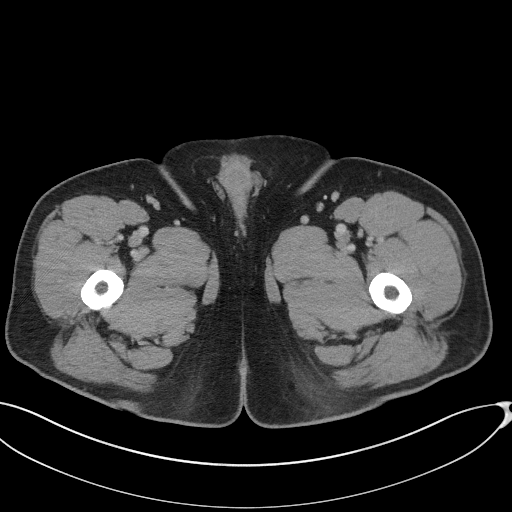
[im 5/67  bone]
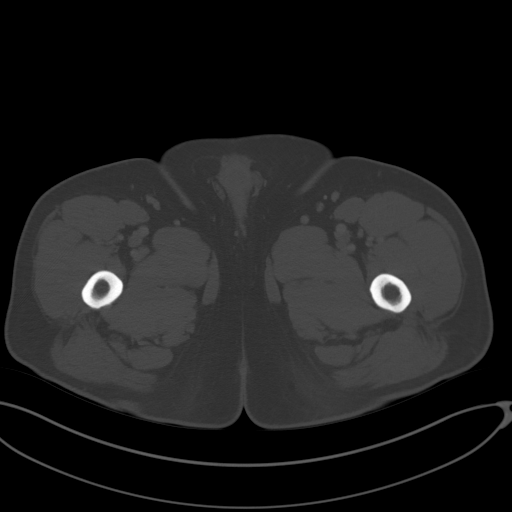
[im 9/67  soft-tissue]
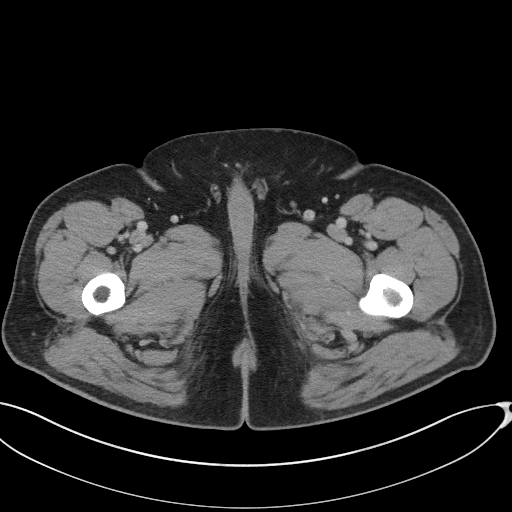
[im 14/67  soft-tissue]
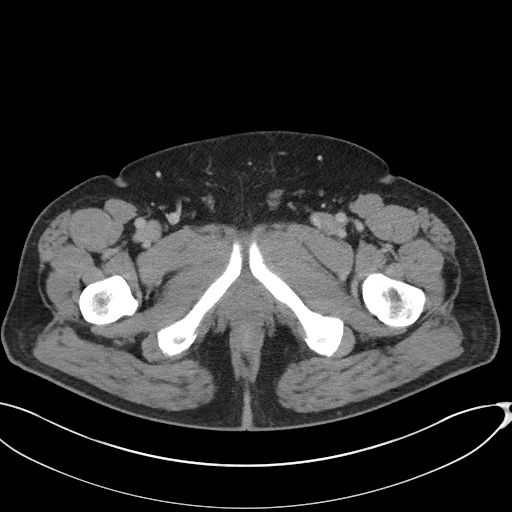
[im 18/67  soft-tissue]
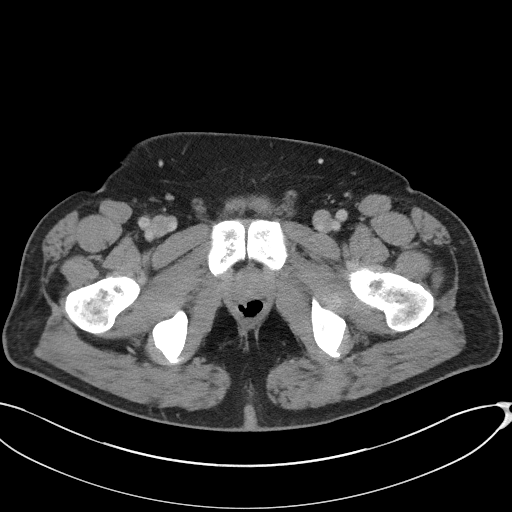
[im 23/67  soft-tissue]
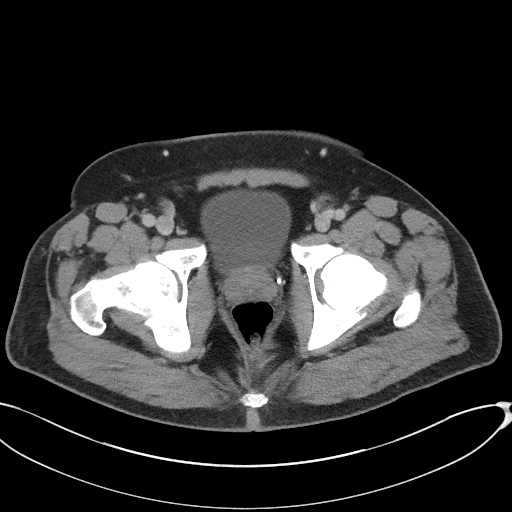
[im 27/67  soft-tissue]
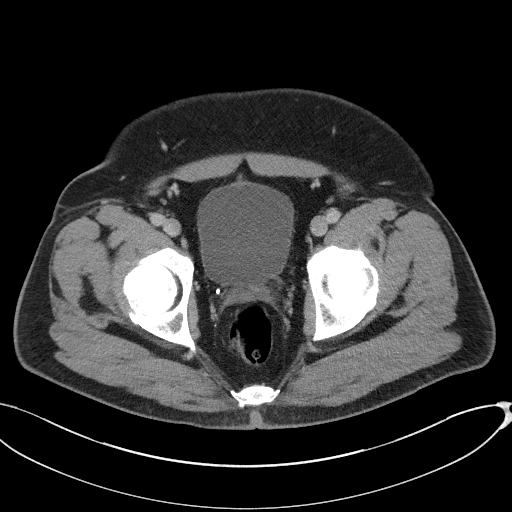
[im 31/67  soft-tissue]
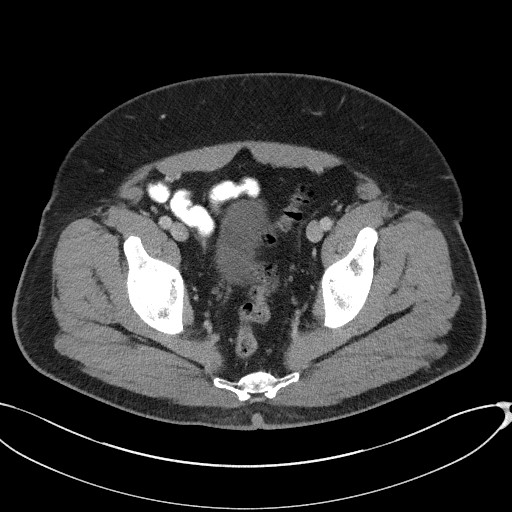
[im 36/67  soft-tissue]
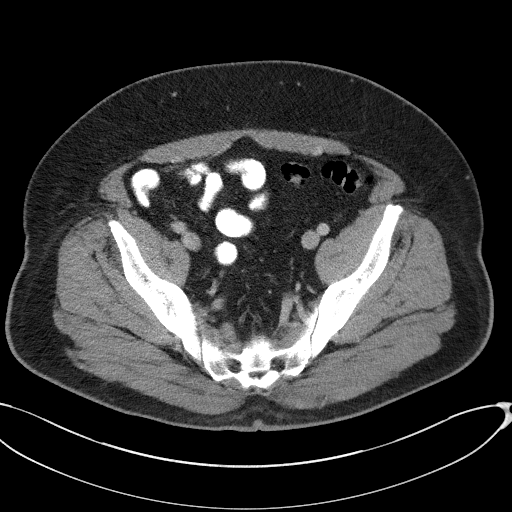
[im 40/67  soft-tissue]
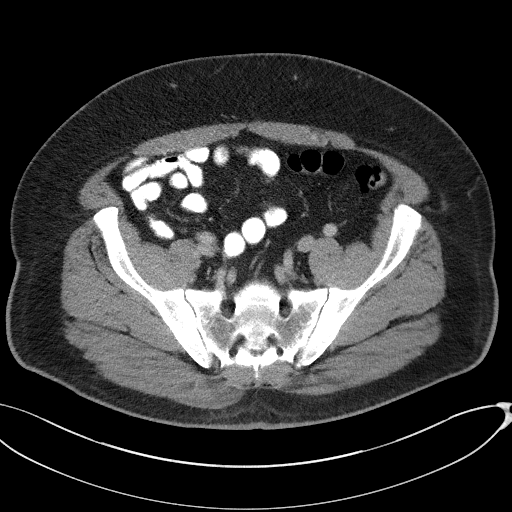
[im 40/67  bone]
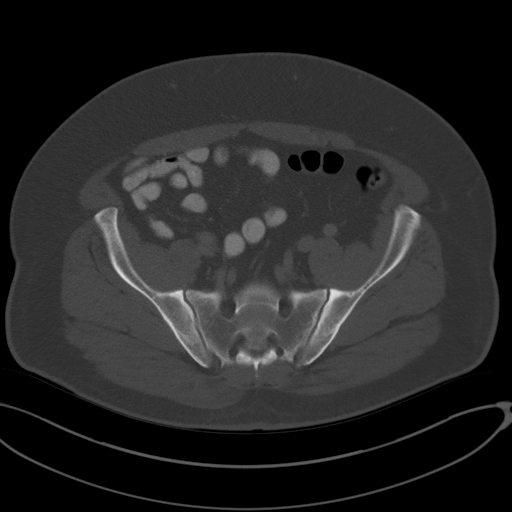
[im 45/67  soft-tissue]
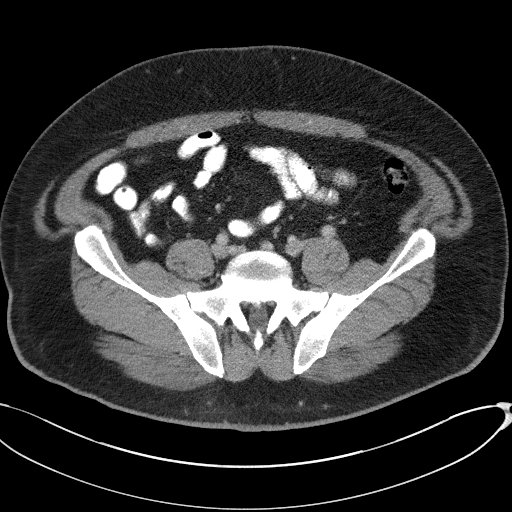
[im 49/67  soft-tissue]
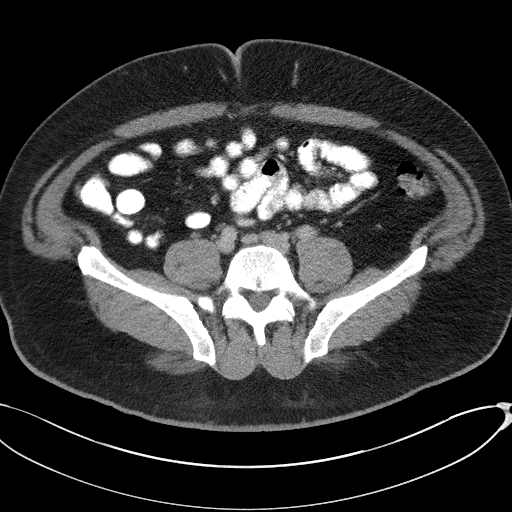
[im 53/67  soft-tissue]
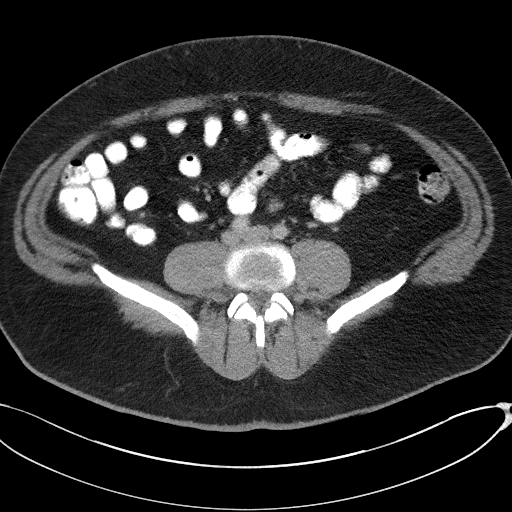
[im 58/67  soft-tissue]
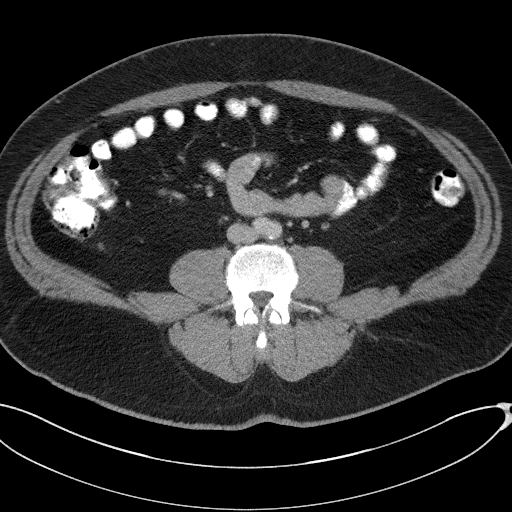
[im 62/67  soft-tissue]
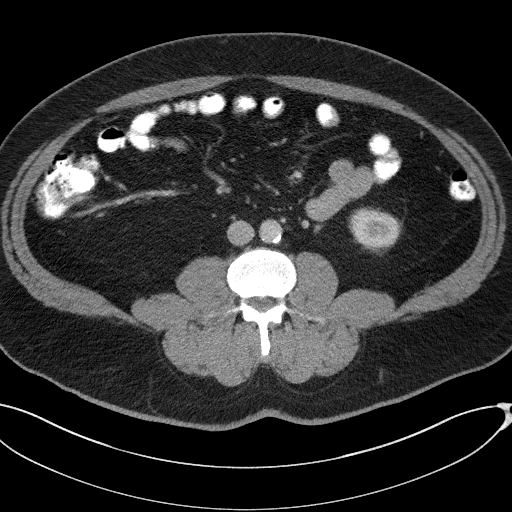

[Series 5: coronal st · coronal · 0.69mm/px · 3 of 117 slices shown]
[im 39/117  soft-tissue]
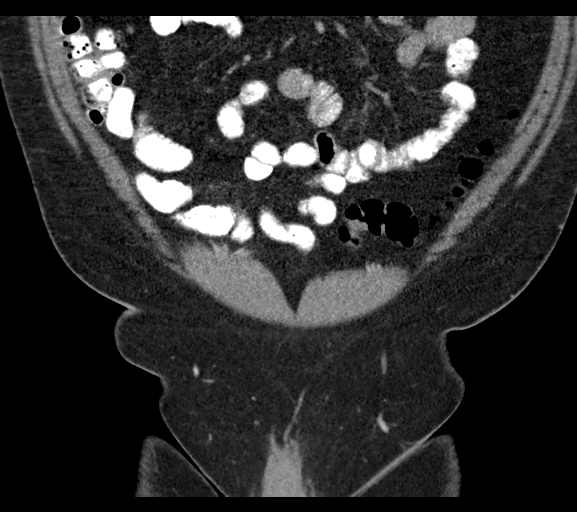
[im 52/117  soft-tissue]
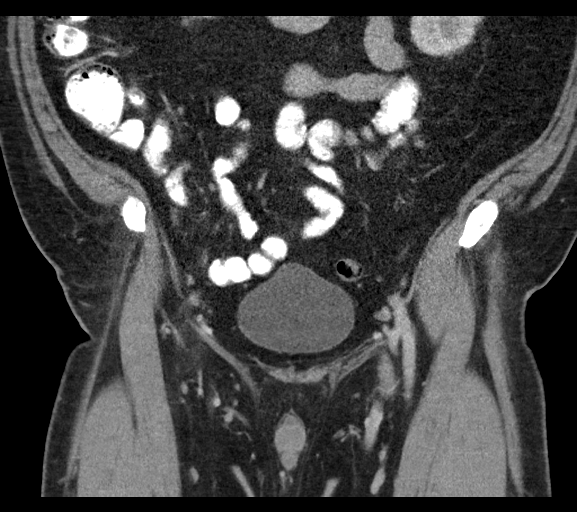
[im 65/117  soft-tissue]
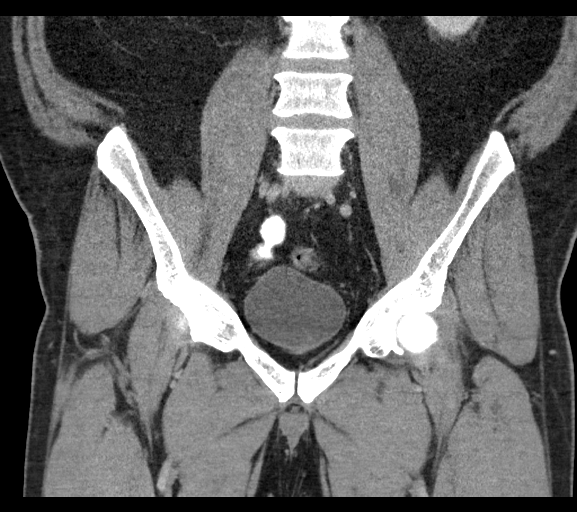

[17 of 46 positions shown; findings below may reference images not displayed]

FINDINGS: Urinary Tract:  No abnormality visualized.

Bowel:  Unremarkable visualized pelvic bowel loops.

Vascular/Lymphatic: Aortic atherosclerosis. No aneurysm visualized.
No adenopathy within the abdomen or pelvis.

Reproductive:  Prostate gland is unremarkable.  No mass identified.

Other: There is no ascites or focal fluid collections within the
abdomen or pelvis. No hernia identified.

Musculoskeletal: No suspicious bone lesions identified.
IMPRESSION: 1. No acute findings within the pelvis.
2. No hernia identified.

## 2019-09-17 ENCOUNTER — Other Ambulatory Visit (INDEPENDENT_AMBULATORY_CARE_PROVIDER_SITE_OTHER): Payer: Self-pay | Admitting: *Deleted

## 2019-09-17 DIAGNOSIS — Z1211 Encounter for screening for malignant neoplasm of colon: Secondary | ICD-10-CM

## 2019-09-18 ENCOUNTER — Encounter (INDEPENDENT_AMBULATORY_CARE_PROVIDER_SITE_OTHER): Payer: Self-pay | Admitting: *Deleted

## 2019-09-18 ENCOUNTER — Telehealth (INDEPENDENT_AMBULATORY_CARE_PROVIDER_SITE_OTHER): Payer: Self-pay | Admitting: *Deleted

## 2019-09-18 MED ORDER — SUTAB 1479-225-188 MG PO TABS: 1.0000 | ORAL_TABLET | Freq: Once | ORAL | 0 refills | Status: AC

## 2019-09-18 NOTE — Telephone Encounter (Signed)
Referring MD/PCP: pickard   Procedure: tcs  Reason/Indication:  screening  Has patient had this procedure before?  no  If so, when, by whom and where?    Is there a family history of colon cancer?  no  Who?  What age when diagnosed?    Is patient diabetic?   no      Does patient have prosthetic heart valve or mechanical valve?  no  Do you have a pacemaker/defibrillator?  no  Has patient ever had endocarditis/atrial fibrillation? no  Does patient use oxygen? no  Has patient had joint replacement within last 12 months?  no  Is patient constipated or do they take laxatives? no  Does patient have a history of alcohol/drug use?  no  Is patient on blood thinner such as Coumadin, Plavix and/or Aspirin? no  Medications: losartan 100 mg daily, omeprezaole 40 mg daily, hctz 25 mg daily, atorvastatin 20 mg daily  Allergies: nkda  Medication Adjustment per Dr Rehman/Dr Jenetta Downer   Procedure date & time: 11/05/19

## 2019-09-18 NOTE — Telephone Encounter (Signed)
Patient needs Sutab (copay card) ° °

## 2019-10-27 ENCOUNTER — Other Ambulatory Visit (INDEPENDENT_AMBULATORY_CARE_PROVIDER_SITE_OTHER): Payer: Self-pay | Admitting: *Deleted

## 2019-11-02 ENCOUNTER — Encounter (INDEPENDENT_AMBULATORY_CARE_PROVIDER_SITE_OTHER): Payer: Self-pay

## 2019-11-03 ENCOUNTER — Other Ambulatory Visit (HOSPITAL_COMMUNITY)
Admission: RE | Admit: 2019-11-03 | Discharge: 2019-11-03 | Disposition: A | Discharge status: Home / Self Care | Payer: Managed Care, Other (non HMO) | Source: Ambulatory Visit | Attending: Internal Medicine | Admitting: Internal Medicine

## 2019-11-03 ENCOUNTER — Other Ambulatory Visit: Payer: Self-pay

## 2019-11-03 DIAGNOSIS — Z20822 Contact with and (suspected) exposure to covid-19: Secondary | ICD-10-CM | POA: Diagnosis not present

## 2019-11-03 DIAGNOSIS — Z01812 Encounter for preprocedural laboratory examination: Secondary | ICD-10-CM | POA: Insufficient documentation

## 2019-11-03 LAB — SARS CORONAVIRUS 2 (TAT 6-24 HRS): SARS Coronavirus 2: NEGATIVE

## 2019-11-05 ENCOUNTER — Encounter (HOSPITAL_COMMUNITY): Payer: Self-pay | Admitting: Internal Medicine

## 2019-11-05 ENCOUNTER — Other Ambulatory Visit: Payer: Self-pay

## 2019-11-05 ENCOUNTER — Ambulatory Visit (HOSPITAL_COMMUNITY)
Admission: RE | Admit: 2019-11-05 | Discharge: 2019-11-05 | Disposition: A | Discharge status: Home / Self Care | Payer: Managed Care, Other (non HMO) | Attending: Internal Medicine | Admitting: Internal Medicine

## 2019-11-05 ENCOUNTER — Encounter (HOSPITAL_COMMUNITY)
Admission: RE | Disposition: A | Discharge status: Home / Self Care | Payer: Self-pay | Source: Home / Self Care | Attending: Internal Medicine

## 2019-11-05 DIAGNOSIS — Z79899 Other long term (current) drug therapy: Secondary | ICD-10-CM | POA: Diagnosis not present

## 2019-11-05 DIAGNOSIS — E785 Hyperlipidemia, unspecified: Secondary | ICD-10-CM | POA: Diagnosis not present

## 2019-11-05 DIAGNOSIS — K219 Gastro-esophageal reflux disease without esophagitis: Secondary | ICD-10-CM | POA: Diagnosis not present

## 2019-11-05 DIAGNOSIS — D120 Benign neoplasm of cecum: Secondary | ICD-10-CM | POA: Diagnosis not present

## 2019-11-05 DIAGNOSIS — Z1211 Encounter for screening for malignant neoplasm of colon: Secondary | ICD-10-CM | POA: Insufficient documentation

## 2019-11-05 DIAGNOSIS — D123 Benign neoplasm of transverse colon: Secondary | ICD-10-CM | POA: Diagnosis not present

## 2019-11-05 DIAGNOSIS — I10 Essential (primary) hypertension: Secondary | ICD-10-CM | POA: Diagnosis not present

## 2019-11-05 HISTORY — PX: POLYPECTOMY: SHX5525

## 2019-11-05 HISTORY — PX: COLONOSCOPY: SHX5424

## 2019-11-05 SURGERY — COLONOSCOPY
Anesthesia: Moderate Sedation

## 2019-11-05 MED ORDER — MIDAZOLAM HCL 5 MG/5ML IJ SOLN
INTRAMUSCULAR | Status: DC | PRN
  Administered 2019-11-05 (×5): 2 mg via INTRAVENOUS

## 2019-11-05 MED ORDER — MIDAZOLAM HCL 5 MG/5ML IJ SOLN
INTRAMUSCULAR | Status: AC
  Filled 2019-11-05: qty 10

## 2019-11-05 MED ORDER — MEPERIDINE HCL 50 MG/ML IJ SOLN
INTRAMUSCULAR | Status: DC | PRN
Start: 2019-11-05 — End: 2019-11-05
  Administered 2019-11-05 (×4): 25 mg via INTRAVENOUS

## 2019-11-05 MED ORDER — STERILE WATER FOR IRRIGATION IR SOLN
Status: DC | PRN
  Administered 2019-11-05: 1.5 mL

## 2019-11-05 MED ORDER — SODIUM CHLORIDE 0.9 % IV SOLN
INTRAVENOUS | Status: DC
  Administered 2019-11-05: 20 mL/h via INTRAVENOUS

## 2019-11-05 MED ORDER — MEPERIDINE HCL 50 MG/ML IJ SOLN
INTRAMUSCULAR | Status: AC
  Filled 2019-11-05: qty 1

## 2019-11-05 NOTE — Op Note (Signed)
[facility] Patient Name: Travis Sanders Procedure Date: 11/05/2019 10:01 AM MRN: [id number] Date of Birth: Apr 06, 1970 Attending MD: Hildred Laser , MD CSN: [id number] Age: 49 Admit Type: Outpatient Procedure:                Colonoscopy Indications:              Screening for colorectal malignant neoplasm Providers:                Hildred Laser, MD, Otis Peak B. Sharon Seller, RN, Caprice Kluver, Raphael Gibney, Technician Referring MD:             Cammie Mcgee. Pickard, MD Medicines:                Meperidine 100 mg IV, Midazolam 10 mg IV Complications:            No immediate complications. Estimated Blood Loss:     Estimated blood loss was minimal. Procedure:                Pre-Anesthesia Assessment:                           - Prior to the procedure, a History and Physical                            was performed, and patient medications and                            allergies were reviewed. The patient's tolerance of                            previous anesthesia was also reviewed. The risks                            and benefits of the procedure and the sedation                            options and risks were discussed with the patient.                            All questions were answered, and informed consent                            was obtained. Prior Anticoagulants: The patient has                            taken no previous anticoagulant or antiplatelet                            agents. ASA Grade Assessment: II - A patient with                            mild systemic disease. After reviewing the risks  and benefits, the patient was deemed in                            satisfactory condition to undergo the procedure.                           After obtaining informed consent, the colonoscope                            was passed under direct vision. Throughout the                            procedure, the patient's blood  pressure, pulse, and                            oxygen saturations were monitored continuously. The                            PCF-[id number] ([id number]) was introduced through the                            anus and advanced to the the cecum, identified by                            appendiceal orifice and ileocecal valve. The                            colonoscopy was performed without difficulty. The                            patient tolerated the procedure well. The quality                            of the bowel preparation was adequate. The                            ileocecal valve, appendiceal orifice, and rectum                            were photographed. Scope In: 10:21:00 AM Scope Out: 10:48:21 AM Scope Withdrawal Time: 0 hours 22 minutes 32 seconds  Total Procedure Duration: 0 hours 27 minutes 21 seconds  Findings:      The perianal and digital rectal examinations were normal.      A 15 mm polyp was found in the cecum. The polyp was flat. Area was       successfully injected with 5 mL Eleview for lesion assessment, and this       injection appeared to lift the lesion adequately. The polyp was removed       with a piecemeal technique using a hot snare. Resection and retrieval       were complete. The pathology specimen was placed into Bottle Number 2.      A small polyp was found in the splenic flexure. The polyp was sessile.       It was cold snared. The pathology specimen  was placed into Bottle Number       1.      The retroflexed view of the distal rectum and anal verge was normal and       showed no anal or rectal abnormalities. Impression:               - One 15 mm polyp in the cecum, removed piecemeal                            using a hot snare. Resected and retrieved. Injected.                           - One small polyp at the splenic flexure. Cold                            snared. Moderate Sedation:      Moderate (conscious) sedation was administered by the endoscopy  nurse       and supervised by the endoscopist. The following parameters were       monitored: oxygen saturation, heart rate, blood pressure, CO2       capnography and response to care. Total physician intraservice time was       37 minutes. Recommendation:           - Patient has a contact number available for                            emergencies. The signs and symptoms of potential                            delayed complications were discussed with the                            patient. Return to normal activities tomorrow.                            Written discharge instructions were provided to the                            patient.                           - Resume previous diet today.                           - Continue present medications.                           - No aspirin, ibuprofen, naproxen, or other                            non-steroidal anti-inflammatory drugs for 7 days.                           - Await pathology results.                           -  Repeat colonoscopy is recommended. The                            colonoscopy date will be determined after pathology                            results from today's exam become available for                            review. Procedure Code(s):        --- Professional ---                           [phone], Colonoscopy, flexible; with removal of                            tumor(s), polyp(s), or other lesion(s) by snare                            technique                           45381, Colonoscopy, flexible; with directed                            submucosal injection(s), any substance                           13244, 59, Colonoscopy, flexible; with biopsy,                            single or multiple                           99153, Moderate sedation; each additional 15                            minutes intraservice time                           G0500, Moderate sedation services provided by the                             same physician or other qualified health care                            professional performing a gastrointestinal                            endoscopic service that sedation supports,                            requiring the presence of an independent trained                            observer to assist in the monitoring of the  patient's level of consciousness and physiological                            status; initial 15 minutes of intra-service time;                            patient age 73 years or older (additional time may                            be reported with [phone], as appropriate) Diagnosis Code(s):        --- Professional ---                           Z12.11, Encounter for screening for malignant                            neoplasm of colon                           K63.5, Polyp of colon CPT copyright 2019 American Medical Association. All rights reserved. The codes documented in this report are preliminary and upon coder review may  be revised to meet current compliance requirements. Hildred Laser, MD Hildred Laser, MD 11/05/2019 10:58:34 AM This report has been signed electronically. Number of Addenda: 0

## 2019-11-05 NOTE — H&P (Signed)
Travis Sanders is an 49 y.o. male.   Chief Complaint: Patient is here for colonoscopy HPI: Patient is 49 year old Caucasian male who is here for screening colonoscopy.  He denies abdominal pain change in bowel habits or rectal bleeding. Family history is negative for CRC. He does not take aspirin or anticoagulants.  Past Medical History:  Diagnosis Date  . GERD (gastroesophageal reflux disease)   . Hyperlipidemia   . Hypertension     Past Surgical History:  Procedure Laterality Date  . KNEE ARTHROSCOPY    . TONSILLECTOMY      History reviewed. No pertinent family history. Social History:  reports that he has never smoked. He has never used smokeless tobacco. He reports current alcohol use. He reports that he does not use drugs.  Allergies: No Known Allergies  Medications Prior to Admission  Medication Sig Dispense Refill  . atorvastatin (LIPITOR) 20 MG tablet Take 1 tablet (20 mg total) by mouth daily. 90 tablet 3  . hydrochlorothiazide (HYDRODIURIL) 25 MG tablet Take 1 tablet (25 mg total) by mouth daily. 90 tablet 3  . losartan (COZAAR) 100 MG tablet Take 1 tablet (100 mg total) by mouth daily. 90 tablet 3  . omeprazole (PRILOSEC) 40 MG capsule TAKE 1 CAPSULE BY MOUTH EVERY DAY (Patient taking differently: Take 40 mg by mouth at bedtime. ) 90 capsule 3  . Probiotic Product (PROBIOTIC DAILY PO) Take 1 tablet by mouth daily.    . sildenafil (VIAGRA) 100 MG tablet Take 100 mg by mouth daily as needed for erectile dysfunction.    Marland Kitchen XIIDRA 5 % SOLN Place 1 drop into both eyes 2 (two) times daily.    Marland Kitchen levocetirizine (XYZAL) 5 MG tablet Take 1 tablet (5 mg total) by mouth every evening. (Patient not taking: Reported on 10/26/2019) 30 tablet 0    No results found for this or any previous visit (from the past 48 hour(s)). No results found.  Review of Systems  Blood pressure (!) 148/96, pulse 93, temperature 98.7 F (37.1 C), temperature source Oral, resp. rate (!) 24, height 6'  (1.829 m), weight 120.2 kg, SpO2 98 %. Physical Exam HENT:     Mouth/Throat:     Mouth: Mucous membranes are moist.     Pharynx: Oropharynx is clear.  Eyes:     General: No scleral icterus.    Conjunctiva/sclera: Conjunctivae normal.  Cardiovascular:     Rate and Rhythm: Normal rate and regular rhythm.     Heart sounds: Normal heart sounds. No murmur heard.   Pulmonary:     Breath sounds: Normal breath sounds.  Abdominal:     Comments: Abdomen is full.  Soft and nontender with no organomegaly or masses  Musculoskeletal:     Cervical back: Neck supple.  Lymphadenopathy:     Cervical: No cervical adenopathy.  Skin:    General: Skin is warm and dry.  Neurological:     Mental Status: He is alert.      Assessment/Plan Average risk screening colonoscopy.   Hildred Laser, MD 11/05/2019, 10:08 AM

## 2019-11-05 NOTE — OR Nursing (Signed)
Travis Sanders had a procedure at [facility] in [place] on 11/05/2019.

## 2019-11-05 NOTE — Discharge Instructions (Signed)
Colonoscopy, Adult, Care After This sheet gives you information about how to care for yourself after your procedure. Your doctor may also give you more specific instructions. If you have problems or questions, call your doctor. What can I expect after the procedure? After the procedure, it is common to have:  A small amount of blood in your poop (stool) for 24 hours.  Some gas.  Mild cramping or bloating in your belly (abdomen). Follow these instructions at home: Eating and drinking   Drink enough fluid to keep your pee (urine) pale yellow.  Follow instructions from your doctor about what you cannot eat or drink.  Return to your normal diet as told by your doctor. Avoid heavy or fried foods that are hard to digest. Activity  Rest as told by your doctor.  Do not sit for a long time without moving. Get up to take short walks every 1-2 hours. This is important. Ask for help if you feel weak or unsteady.  Return to your normal activities as told by your doctor. Ask your doctor what activities are safe for you. To help cramping and bloating:   Try walking around.  Put heat on your belly as told by your doctor. Use the heat source that your doctor recommends, such as a moist heat pack or a heating pad. ? Put a towel between your skin and the heat source. ? Leave the heat on for 20-30 minutes. ? Remove the heat if your skin turns bright red. This is very important if you are unable to feel pain, heat, or cold. You may have a greater risk of getting burned. General instructions  For the first 24 hours after the procedure: ? Do not drive or use machinery. ? Do not sign important documents. ? Do not drink alcohol. ? Do your daily activities more slowly than normal. ? Eat foods that are soft and easy to digest.  Take over-the-counter or prescription medicines only as told by your doctor.  Keep all follow-up visits as told by your doctor. This is important. Contact a doctor  if:  You have blood in your poop 2-3 days after the procedure. Get help right away if:  You have more than a small amount of blood in your poop.  You see large clumps of tissue (blood clots) in your poop.  Your belly is swollen.  You feel like you may vomit (nauseous).  You vomit.  You have a fever.  You have belly pain that gets worse, and medicine does not help your pain. Summary  After the procedure, it is common to have a small amount of blood in your poop. You may also have mild cramping and bloating in your belly.  For the first 24 hours after the procedure, do not drive or use machinery, do not sign important documents, and do not drink alcohol.  Get help right away if you have a lot of blood in your poop, feel like you may vomit, have a fever, or have more belly pain. This information is not intended to replace advice given to you by your health care provider. Make sure you discuss any questions you have with your health care provider. Document Revised: 09/29/2018 Document Reviewed: 09/29/2018 Elsevier Patient Education  [place]. Colon Polyps  Polyps are tissue growths inside the body. Polyps can grow in many places, including the large intestine (colon). A polyp may be a round bump or a mushroom-shaped growth. You could have one polyp or several. Most  colon polyps are noncancerous (benign). However, some colon polyps can become cancerous over time. Finding and removing the polyps early can help prevent this. What are the causes? The exact cause of colon polyps is not known. What increases the risk? You are more likely to develop this condition if you:  Have a family history of colon cancer or colon polyps.  Are older than 10 or older than 45 if you are African American.  Have inflammatory bowel disease, such as ulcerative colitis or Crohn's disease.  Have certain hereditary conditions, such as: ? Familial adenomatous polyposis. ? Lynch  syndrome. ? Turcot syndrome. ? Peutz-Jeghers syndrome.  Are overweight.  Smoke cigarettes.  Do not get enough exercise.  Drink too much alcohol.  Eat a diet that is high in fat and red meat and low in fiber.  Had childhood cancer that was treated with abdominal radiation. What are the signs or symptoms? Most polyps do not cause symptoms. If you have symptoms, they may include:  Blood coming from your rectum when having a bowel movement.  Blood in your stool. The stool may look dark red or black.  Abdominal pain.  A change in bowel habits, such as constipation or diarrhea. How is this diagnosed? This condition is diagnosed with a colonoscopy. This is a procedure in which a lighted, flexible scope is inserted into the anus and then passed into the colon to examine the area. Polyps are sometimes found when a colonoscopy is done as part of routine cancer screening tests. How is this treated? Treatment for this condition involves removing any polyps that are found. Most polyps can be removed during a colonoscopy. Those polyps will then be tested for cancer. Additional treatment may be needed depending on the results of testing. Follow these instructions at home: Lifestyle  Maintain a healthy weight, or lose weight if recommended by your health care provider.  Exercise every day or as told by your health care provider.  Do not use any products that contain nicotine or tobacco, such as cigarettes and e-cigarettes. If you need help quitting, ask your health care provider.  If you drink alcohol, limit how much you have: ? 0-1 drink a day for women. ? 0-2 drinks a day for men.  Be aware of how much alcohol is in your drink. In the [place] one drink equals one 12 oz bottle of beer (355 mL), one 5 oz glass of wine (148 mL), or one 1 oz shot of hard liquor (44 mL). Eating and drinking   Eat foods that are high in fiber, such as fruits, vegetables, and whole grains.  Eat foods that  are high in calcium and vitamin D, such as milk, cheese, yogurt, eggs, liver, fish, and broccoli.  Limit foods that are high in fat, such as fried foods and desserts.  Limit the amount of red meat and processed meat you eat, such as hot dogs, sausage, bacon, and lunch meats. General instructions  Keep all follow-up visits as told by your health care provider. This is important. ? This includes having regularly scheduled colonoscopies. ? Talk to your health care provider about when you need a colonoscopy. Contact a health care provider if:  You have new or worsening bleeding during a bowel movement.  You have new or increased blood in your stool.  You have a change in bowel habits.  You lose weight for no known reason. Summary  Polyps are tissue growths inside the body. Polyps can grow in many places,  including the colon.  Most colon polyps are noncancerous (benign), but some can become cancerous over time.  This condition is diagnosed with a colonoscopy.  Treatment for this condition involves removing any polyps that are found. Most polyps can be removed during a colonoscopy. This information is not intended to replace advice given to you by your health care provider. Make sure you discuss any questions you have with your health care provider. Document Revised: 06/20/2017 Document Reviewed: 06/20/2017 Elsevier Patient Education  [place].   No aspirin or NSAIDs for 1 week. Resume usual medications and diet as before No driving for 24 hours. Physician will call with biopsy results and further recommendations.

## 2019-11-05 NOTE — OR Nursing (Signed)
Travis Sanders was at [facility] in [place] for a procedure. He is not to drive for the next 24 hours due to medication given during procedure. He can return to work on 11/07/2018.

## 2019-11-06 LAB — SURGICAL PATHOLOGY

## 2019-11-09 ENCOUNTER — Encounter (HOSPITAL_COMMUNITY): Payer: Self-pay | Admitting: Internal Medicine

## 2020-01-14 ENCOUNTER — Other Ambulatory Visit: Payer: Self-pay

## 2020-01-21 ENCOUNTER — Other Ambulatory Visit: Payer: Self-pay

## 2020-01-21 ENCOUNTER — Telehealth: Payer: Self-pay

## 2020-01-21 NOTE — Telephone Encounter (Signed)
Mr. Travis Sanders called his Sildenafil isn't working well for him, he said nothing has changed he's ok, he doesn't know if its because he's been on it for years the reason isn't working. Asked if he could have something new sent in for him?

## 2020-01-22 ENCOUNTER — Other Ambulatory Visit: Payer: Self-pay

## 2020-01-22 ENCOUNTER — Other Ambulatory Visit: Payer: Self-pay | Admitting: Family Medicine

## 2020-01-22 MED ORDER — TADALAFIL 20 MG PO TABS: 10.0000 mg | ORAL_TABLET | ORAL | 11 refills | Status: DC | PRN

## 2020-01-22 MED ORDER — TADALAFIL 20 MG PO TABS
10.0000 mg | ORAL_TABLET | ORAL | 11 refills | Status: DC | PRN
Start: 2020-01-22 — End: 2020-01-22

## 2020-01-22 NOTE — Telephone Encounter (Signed)
Pt needed rx sent to [facility] in [place], rx canceled at [facility] and sent to correct Pharmacy for him.

## 2020-01-22 NOTE — Telephone Encounter (Signed)
I sent in [facility] to [facility] on [address].  Stop viagra.

## 2020-06-24 ENCOUNTER — Other Ambulatory Visit: Payer: Self-pay | Admitting: Family Medicine

## 2020-06-24 ENCOUNTER — Other Ambulatory Visit: Payer: Self-pay | Admitting: *Deleted

## 2020-06-24 MED ORDER — LOSARTAN POTASSIUM 100 MG PO TABS
100.0000 mg | ORAL_TABLET | Freq: Every day | ORAL | 0 refills | Status: DC
Start: 2020-06-24 — End: 2020-07-21

## 2020-06-24 MED ORDER — OMEPRAZOLE 40 MG PO CPDR: DELAYED_RELEASE_CAPSULE | ORAL | 0 refills | Status: DC

## 2020-07-21 ENCOUNTER — Other Ambulatory Visit: Payer: Self-pay | Admitting: Family Medicine

## 2020-08-19 ENCOUNTER — Other Ambulatory Visit: Payer: Self-pay | Admitting: Family Medicine

## 2020-09-15 ENCOUNTER — Other Ambulatory Visit: Payer: Self-pay | Admitting: Family Medicine

## 2020-09-29 ENCOUNTER — Other Ambulatory Visit: Payer: Self-pay | Admitting: Family Medicine

## 2020-10-20 ENCOUNTER — Other Ambulatory Visit: Payer: Self-pay | Admitting: Family Medicine

## 2020-10-27 ENCOUNTER — Other Ambulatory Visit: Payer: Self-pay

## 2020-10-27 ENCOUNTER — Other Ambulatory Visit: Payer: No Typology Code available for payment source

## 2020-10-27 DIAGNOSIS — Z13220 Encounter for screening for lipoid disorders: Secondary | ICD-10-CM

## 2020-10-27 DIAGNOSIS — Z1159 Encounter for screening for other viral diseases: Secondary | ICD-10-CM

## 2020-10-27 DIAGNOSIS — I10 Essential (primary) hypertension: Secondary | ICD-10-CM

## 2020-10-27 DIAGNOSIS — Z125 Encounter for screening for malignant neoplasm of prostate: Secondary | ICD-10-CM

## 2020-10-27 DIAGNOSIS — E785 Hyperlipidemia, unspecified: Secondary | ICD-10-CM

## 2020-10-28 LAB — COMPLETE METABOLIC PANEL WITH GFR
AG Ratio: 1.4 (calc) (ref 1.0–2.5)
ALT: 41 U/L (ref 9–46)
AST: 21 U/L (ref 10–35)
Albumin: 3.9 g/dL (ref 3.6–5.1)
Alkaline phosphatase (APISO): 116 U/L (ref 35–144)
BUN: 18 mg/dL (ref 7–25)
CO2: 27 mmol/L (ref 20–32)
Calcium: 9.2 mg/dL (ref 8.6–10.3)
Chloride: 100 mmol/L (ref 98–110)
Creat: 1.05 mg/dL (ref 0.70–1.30)
Globulin: 2.7 g/dL (calc) (ref 1.9–3.7)
Glucose, Bld: 107 mg/dL — ABNORMAL HIGH (ref 65–99)
Potassium: 4.1 mmol/L (ref 3.5–5.3)
Sodium: 139 mmol/L (ref 135–146)
Total Bilirubin: 0.4 mg/dL (ref 0.2–1.2)
Total Protein: 6.6 g/dL (ref 6.1–8.1)
eGFR: 85 mL/min/{1.73_m2} (ref >=60)

## 2020-10-28 LAB — CBC WITH DIFFERENTIAL/PLATELET
Absolute Monocytes: 392 cells/uL (ref 200–950)
Basophils Absolute: 32 cells/uL (ref 0–200)
Basophils Relative: 0.6 %
Eosinophils Absolute: 133 cells/uL (ref 15–500)
Eosinophils Relative: 2.5 %
HCT: 42.5 % (ref 38.5–50.0)
Hemoglobin: 14.2 g/dL (ref 13.2–17.1)
Lymphs Abs: 1696 cells/uL (ref 850–3900)
MCH: 30.7 pg (ref 27.0–33.0)
MCHC: 33.4 g/dL (ref 32.0–36.0)
MCV: 92 fL (ref 80.0–100.0)
MPV: 9.3 fL (ref 7.5–12.5)
Monocytes Relative: 7.4 %
Neutro Abs: 3048 cells/uL (ref 1500–7800)
Neutrophils Relative %: 57.5 %
Platelets: 306 10*3/uL (ref 140–400)
RBC: 4.62 10*6/uL (ref 4.20–5.80)
RDW: 12.8 % (ref 11.0–15.0)
Total Lymphocyte: 32 %
WBC: 5.3 10*3/uL (ref 3.8–10.8)

## 2020-10-28 LAB — HEPATITIS C ANTIBODY
Hepatitis C Ab: NONREACTIVE
SIGNAL TO CUT-OFF: 0.01 (ref <1.00)

## 2020-10-28 LAB — LIPID PANEL
Cholesterol: 138 mg/dL (ref <200)
HDL: 47 mg/dL (ref >=40)
LDL Cholesterol (Calc): 72 mg/dL (calc)
Non-HDL Cholesterol (Calc): 91 mg/dL (calc) (ref <130)
Total CHOL/HDL Ratio: 2.9 (calc) (ref <5.0)
Triglycerides: 106 mg/dL (ref <150)

## 2020-10-28 LAB — PSA: PSA: 0.67 ng/mL (ref <=4.00)

## 2020-11-03 ENCOUNTER — Encounter: Payer: Self-pay | Admitting: Family Medicine

## 2020-11-03 ENCOUNTER — Other Ambulatory Visit: Payer: Self-pay

## 2020-11-03 ENCOUNTER — Ambulatory Visit (INDEPENDENT_AMBULATORY_CARE_PROVIDER_SITE_OTHER): Payer: No Typology Code available for payment source | Admitting: Family Medicine

## 2020-11-03 VITALS — BP 134/78 | HR 88 | Temp 99.2°F | Resp 18 | Ht 72.0 in | Wt 303.0 lb

## 2020-11-03 DIAGNOSIS — G4710 Hypersomnia, unspecified: Secondary | ICD-10-CM | POA: Diagnosis not present

## 2020-11-03 DIAGNOSIS — Z0000 Encounter for general adult medical examination without abnormal findings: Secondary | ICD-10-CM

## 2020-11-03 DIAGNOSIS — I10 Essential (primary) hypertension: Secondary | ICD-10-CM | POA: Diagnosis not present

## 2020-11-03 DIAGNOSIS — Z0001 Encounter for general adult medical examination with abnormal findings: Secondary | ICD-10-CM

## 2020-11-03 DIAGNOSIS — E785 Hyperlipidemia, unspecified: Secondary | ICD-10-CM

## 2020-11-03 NOTE — Progress Notes (Signed)
Subjective:    Patient ID: Travis Sanders, male    DOB: [birth year], 50 y.o.   MRN: [id number]  HPI Patient is a very pleasant 89 white male who is here today for a physical exam.  BMI is elevated at 41.  He reports hypersomnolence.  He has a large neck circumference and a short mandible.  He also is showing some pitting edema in his extremities concerning for diastolic dysfunction.  I believe the patient is very high risk for obstructive sleep apnea.  He to believes that he may have it as his family members have heard him stop breathing at night and states that he snores loudly.  Therefore he would like to get a sleep study as well.  He also reports dizziness when he stands up at work.  To me it sounds like orthostatic hypotension.  Of asked him to start checking his blood pressures at home and if too low we may need to reduce his medication.  His last colonoscopy was last year and showed 2, tubular adenomas.  He is due again in 2 years. Lab on 10/27/2020  Component Date Value Ref Range Status   WBC 10/27/2020 5.3  3.8 - 10.8 Thousand/uL Final   RBC 10/27/2020 4.62  4.20 - 5.80 Million/uL Final   Hemoglobin 10/27/2020 14.2  13.2 - 17.1 g/dL Final   HCT 10/27/2020 42.5  38.5 - 50.0 % Final   MCV 10/27/2020 92.0  80.0 - 100.0 fL Final   MCH 10/27/2020 30.7  27.0 - 33.0 pg Final   MCHC 10/27/2020 33.4  32.0 - 36.0 g/dL Final   RDW 10/27/2020 12.8  11.0 - 15.0 % Final   Platelets 10/27/2020 306  140 - 400 Thousand/uL Final   MPV 10/27/2020 9.3  7.5 - 12.5 fL Final   Neutro Abs 10/27/2020 3,048  1,500 - 7,800 cells/uL Final   Lymphs Abs 10/27/2020 1,696  850 - 3,900 cells/uL Final   Absolute Monocytes 10/27/2020 392  200 - 950 cells/uL Final   Eosinophils Absolute 10/27/2020 133  15 - 500 cells/uL Final   Basophils Absolute 10/27/2020 32  0 - 200 cells/uL Final   Neutrophils Relative % 10/27/2020 57.5  % Final   Total Lymphocyte 10/27/2020 32.0  % Final   Monocytes Relative 10/27/2020 7.4   % Final   Eosinophils Relative 10/27/2020 2.5  % Final   Basophils Relative 10/27/2020 0.6  % Final   Glucose, Bld 10/27/2020 107 (A) 65 - 99 mg/dL Final   Comment: .            Fasting reference interval . For someone without known diabetes, a glucose value between 100 and 125 mg/dL is consistent with prediabetes and should be confirmed with a follow-up test. .    BUN 10/27/2020 18  7 - 25 mg/dL Final   Creat 10/27/2020 1.05  0.70 - 1.30 mg/dL Final   eGFR 10/27/2020 85  > OR = 60 mL/min/1.25m Final   Comment: The eGFR is based on the CKD-EPI 2021 equation. To calculate  the new eGFR from a previous Creatinine or Cystatin C result, go to https://www.kidney.org/professionals/ kdoqi/gfr%5Fcalculator    BUN/Creatinine Ratio 039/76/7341NOT APPLICABLE  6 - 22 (calc) Final   Sodium 10/27/2020 139  135 - 146 mmol/L Final   Potassium 10/27/2020 4.1  3.5 - 5.3 mmol/L Final   Chloride 10/27/2020 100  98 - 110 mmol/L Final   CO2 10/27/2020 27  20 - 32 mmol/L Final   Calcium 10/27/2020  9.2  8.6 - 10.3 mg/dL Final   Total Protein 10/27/2020 6.6  6.1 - 8.1 g/dL Final   Albumin 10/27/2020 3.9  3.6 - 5.1 g/dL Final   Globulin 10/27/2020 2.7  1.9 - 3.7 g/dL (calc) Final   AG Ratio 10/27/2020 1.4  1.0 - 2.5 (calc) Final   Total Bilirubin 10/27/2020 0.4  0.2 - 1.2 mg/dL Final   Alkaline phosphatase (APISO) 10/27/2020 116  35 - 144 U/L Final   AST 10/27/2020 21  10 - 35 U/L Final   ALT 10/27/2020 41  9 - 46 U/L Final   Cholesterol 10/27/2020 138  <200 mg/dL Final   HDL 10/27/2020 47  > OR = 40 mg/dL Final   Triglycerides 10/27/2020 106  <150 mg/dL Final   LDL Cholesterol (Calc) 10/27/2020 72  mg/dL (calc) Final   Comment: Reference range: <100 . Desirable range <100 mg/dL for primary prevention;   <70 mg/dL for patients with CHD or diabetic patients  with > or = 2 CHD risk factors. Marland Kitchen LDL-C is now calculated using the Martin-Hopkins  calculation, which is a validated novel method providing   better accuracy than the Friedewald equation in the  estimation of LDL-C.  Cresenciano Genre et al. Annamaria Helling. 2725;366(44): 2061-2068  (http://education.[facility].com/faq/FAQ164)    Total CHOL/HDL Ratio 10/27/2020 2.9  <5.0 (calc) Final   Non-HDL Cholesterol (Calc) 10/27/2020 91  <130 mg/dL (calc) Final   Comment: For patients with diabetes plus 1 major ASCVD risk  factor, treating to a non-HDL-C goal of <100 mg/dL  (LDL-C of <70 mg/dL) is considered a therapeutic  option.    PSA 10/27/2020 0.67  < OR = 4.00 ng/mL Final   Comment: The total PSA value from this assay system is  standardized against the WHO standard. The test  result will be approximately 20% lower when compared  to the equimolar-standardized total PSA (Beckman  Coulter). Comparison of serial PSA results should be  interpreted with this fact in mind. . This test was performed using the Siemens  chemiluminescent method. Values obtained from  different assay methods cannot be used interchangeably. PSA levels, regardless of value, should not be interpreted as absolute evidence of the presence or absence of disease.    Hepatitis C Ab 10/27/2020 NON-REACTIVE  NON-REACTIVE Final   SIGNAL TO CUT-OFF 10/27/2020 0.01  <1.00 Final   Comment: . HCV antibody was non-reactive. There is no laboratory  evidence of HCV infection. . In most cases, no further action is required. However, if recent HCV exposure is suspected, a test for HCV RNA (test code [phone]) is suggested. . For additional information please refer to http://education.[facility].com/faq/FAQ22v1 (This link is being provided for informational/ educational purposes only.) .    Past Medical History:  Diagnosis Date   GERD (gastroesophageal reflux disease)    Hyperlipidemia    Hypertension    Past Surgical History:  Procedure Laterality Date   COLONOSCOPY N/A 11/05/2019   Procedure: COLONOSCOPY;  Surgeon: Rogene Houston, MD;  Location: [facility];   Service: Endoscopy;  Laterality: N/A;  1030   KNEE ARTHROSCOPY     POLYPECTOMY  11/05/2019   Procedure: POLYPECTOMY;  Surgeon: Rogene Houston, MD;  Location: [facility];  Service: Endoscopy;;   TONSILLECTOMY     Current Outpatient Medications on File Prior to Visit  Medication Sig Dispense Refill   atorvastatin (LIPITOR) 20 MG tablet TAKE 1 TABLET BY MOUTH EVERY DAY 90 tablet 0   hydrochlorothiazide (HYDRODIURIL) 25 MG tablet TAKE 1  TABLET BY MOUTH EVERY DAY 90 tablet 3   losartan (COZAAR) 100 MG tablet TAKE 1 TABLET BY MOUTH EVERY DAY 30 tablet 0   Multiple Vitamin (MULTIVITAMIN WITH MINERALS) TABS tablet Take 1 tablet by mouth daily.     omeprazole (PRILOSEC) 40 MG capsule TAKE 1 CAPSULE BY MOUTH EVERY DAY 30 capsule 0   tadalafil (CIALIS) 20 MG tablet Take 0.5-1 tablets (10-20 mg total) by mouth every other day as needed for erectile dysfunction. 5 tablet 11   XIIDRA 5 % SOLN Place 1 drop into both eyes 2 (two) times daily.     No current facility-administered medications on file prior to visit.   No Known Allergies Social History   Socioeconomic History   Marital status: Married    Spouse name: Not on file   Number of children: Not on file   Years of education: Not on file   Highest education level: Not on file  Occupational History   Not on file  Tobacco Use   Smoking status: Never   Smokeless tobacco: Never  Substance and Sexual Activity   Alcohol use: Yes    Comment: occasionally   Drug use: No   Sexual activity: Yes  Other Topics Concern   Not on file  Social History Narrative   Not on file   Social Determinants of Health   Financial Resource Strain: Not on file  Food Insecurity: Not on file  Transportation Needs: Not on file  Physical Activity: Not on file  Stress: Not on file  Social Connections: Not on file  Intimate Partner Violence: Not on file   No family history on file.    Review of Systems  All other systems reviewed and are negative.      Objective:   Physical Exam Vitals reviewed.  Constitutional:      General: He is not in acute distress.    Appearance: He is well-developed. He is not diaphoretic.  HENT:     Head: Normocephalic and atraumatic.     Right Ear: External ear normal.     Left Ear: External ear normal.     Nose: Nose normal.     Mouth/Throat:     Pharynx: No oropharyngeal exudate.  Eyes:     General: No scleral icterus.       Right eye: No discharge.        Left eye: No discharge.     Conjunctiva/sclera: Conjunctivae normal.     Pupils: Pupils are equal, round, and reactive to light.  Neck:     Thyroid: No thyromegaly.     Vascular: No JVD.     Trachea: No tracheal deviation.  Cardiovascular:     Rate and Rhythm: Normal rate and regular rhythm.     Heart sounds: Normal heart sounds. No murmur heard.   No friction rub. No gallop.  Pulmonary:     Effort: Pulmonary effort is normal. No respiratory distress.     Breath sounds: Normal breath sounds. No stridor. No wheezing or rales.  Chest:     Chest wall: No tenderness.  Abdominal:     General: Bowel sounds are normal. There is no distension.     Palpations: Abdomen is soft. There is no mass.     Tenderness: There is no abdominal tenderness. There is no guarding or rebound.  Musculoskeletal:        General: No tenderness. Normal range of motion.     Cervical back: Normal range of motion and neck  supple.  Lymphadenopathy:     Cervical: No cervical adenopathy.  Skin:    General: Skin is warm.     Coloration: Skin is not pale.     Findings: No erythema or rash.  Neurological:     Mental Status: He is alert and oriented to person, place, and time.     Cranial Nerves: No cranial nerve deficit.     Motor: No abnormal muscle tone.     Coordination: Coordination normal.     Deep Tendon Reflexes: Reflexes are normal and symmetric.  Psychiatric:        Behavior: Behavior normal.        Thought Content: Thought content normal.        Judgment:  Judgment normal.          Assessment & Plan:  Hypersomnolence - Plan: Ambulatory referral to Sleep Studies  Hyperlipidemia, unspecified hyperlipidemia type  Benign essential HTN  General medical exam  Class 3 severe obesity due to excess calories with serious comorbidity in adult, unspecified BMI ([place]) I am concerned that the patient has some mild peripheral edema likely due to diastolic dysfunction which I believe could be due to to his elevated BMI and also untreated sleep apnea.  He has risk factors for sleep apnea and symptoms as well including witnessed apneic episodes, elevated BMI and certainly a large neck circumference.  Therefore I recommended a sleep study to evaluate further.  Colonoscopy is up-to-date.  Lab work is outstanding as is his blood pressure except for a mildly elevated sugar.  We discussed low carbohydrate diet.  The patient needs to focus on weight loss.  This is his biggest risk factor moving forward.  I have asked him to check his blood pressure daily and if his blood pressure is low occasionally at home causing orthostatic dizziness we can reduce some of his antihypertensives.

## 2020-11-04 ENCOUNTER — Other Ambulatory Visit: Payer: Self-pay | Admitting: *Deleted

## 2020-11-04 DIAGNOSIS — G4710 Hypersomnia, unspecified: Secondary | ICD-10-CM

## 2020-11-23 ENCOUNTER — Other Ambulatory Visit: Payer: Self-pay | Admitting: Family Medicine

## 2020-12-17 ENCOUNTER — Other Ambulatory Visit: Payer: Self-pay | Admitting: Family Medicine

## 2020-12-20 ENCOUNTER — Other Ambulatory Visit: Payer: Self-pay | Admitting: Family Medicine

## 2021-01-18 ENCOUNTER — Other Ambulatory Visit: Payer: Self-pay | Admitting: Family Medicine

## 2021-01-20 ENCOUNTER — Other Ambulatory Visit: Payer: Self-pay | Admitting: Family Medicine

## 2021-01-30 ENCOUNTER — Encounter: Payer: Self-pay | Admitting: Neurology

## 2021-01-30 ENCOUNTER — Ambulatory Visit: Payer: No Typology Code available for payment source | Admitting: Neurology

## 2021-01-30 VITALS — BP 143/86 | HR 70 | Ht 72.0 in | Wt 311.2 lb

## 2021-01-30 DIAGNOSIS — K219 Gastro-esophageal reflux disease without esophagitis: Secondary | ICD-10-CM

## 2021-01-30 DIAGNOSIS — G4719 Other hypersomnia: Secondary | ICD-10-CM | POA: Diagnosis not present

## 2021-01-30 DIAGNOSIS — R635 Abnormal weight gain: Secondary | ICD-10-CM

## 2021-01-30 DIAGNOSIS — R0683 Snoring: Secondary | ICD-10-CM | POA: Diagnosis not present

## 2021-01-30 DIAGNOSIS — R519 Headache, unspecified: Secondary | ICD-10-CM

## 2021-01-30 DIAGNOSIS — R0681 Apnea, not elsewhere classified: Secondary | ICD-10-CM | POA: Diagnosis not present

## 2021-01-30 NOTE — Patient Instructions (Signed)

## 2021-01-30 NOTE — Progress Notes (Signed)
Subjective:    Patient ID: Travis Sanders is a 51 y.o. male.  HPI    Star Age, MD, PhD [facility] [address] [place],  [address]  Dear Dr. Dennard Schaumann,  I saw your patient, Travis Sanders, upon your kind request in my sleep clinic today for initial consultation of his sleep disorder, in particular, concern for underlying obstructive sleep apnea.  The patient is unaccompanied today.  As you know, Travis Sanders is a 50 year old with an underlying medical history of hypertension, hyperlipidemia, reflux disease, and severe obesity with a BMI of over 40, who reports snoring and excessive daytime somnolence as well as witnessed apneas per girlfriend's report.  I reviewed your office note from 11/03/2020.  His Epworth sleepiness score is 17 out of 24, fatigue severity score is 33 out of 63.  He has had significant weight gain over the past few years.  His snoring has become worse and he no longer is able to sleep on his back.  He prefers to sleep on his stomach.  He is divorced, he lives alone, his 50 year old son is there half the time.  Patient denies any night to night nocturia but has had morning headaches which are typically in the bifrontal area, dull and achy.  He does not have a history of migraines.  He has reflux symptoms at night.  He is not aware of any family history of sleep apnea.  He denies any telltale symptoms of restless leg syndrome or twitching in his feet or legs at night.  He drinks caffeine in the form of coffee, 1 cup in the morning, typically no day-to-day soda or tea.  He drinks alcohol occasionally in the form of beer, 1 a week on average.  He is a non-smoker.  He has a TV in the bedroom and watches some TV before falling asleep but turns it off before falling asleep.  Bedtime is generally between 11:30 PM and 12:30 AM and rise time is around 6:15 AM.  He works for [employer], has a sedentary job.  He does  get sleepy at his desk but has not dozed off at the wheel.  He does become sleepy sometimes while driving, he has a fairly long commute of 47 miles.   His Past Medical History Is Significant For: Past Medical History:  Diagnosis Date   GERD (gastroesophageal reflux disease)    Hyperlipidemia    Hypertension     His Past Surgical History Is Significant For: Past Surgical History:  Procedure Laterality Date   COLONOSCOPY N/A 11/05/2019   Procedure: COLONOSCOPY;  Surgeon: Rogene Houston, MD;  [facility]: [facility];  Service: Endoscopy;  Laterality: N/A;  1030   KNEE ARTHROSCOPY     POLYPECTOMY  11/05/2019   Procedure: POLYPECTOMY;  Surgeon: Rogene Houston, MD;  Location: [facility];  Service: Endoscopy;;   TONSILLECTOMY      His Family History Is Significant For: Family History  Problem Relation Age of Onset   Sleep apnea Neg Hx     His Social History Is Significant For: Social History   Socioeconomic History   Marital status: Married    Spouse name: Not on file   Number of children: Not on file   Years of education: Not on file   Highest education level: Not on file  Occupational History   Not on file  Tobacco Use   Smoking status: Never   Smokeless tobacco: Never  Substance and Sexual Activity   Alcohol use: Yes    Alcohol/week: 3.0 standard drinks    Types: 3 Cans of beer per week    Comment: occasionally   Drug use: No   Sexual activity: Yes  Other Topics Concern   Not on file  Social History Narrative   Not on file   Social Determinants of Health   Financial Resource Strain: Not on file  Food Insecurity: Not on file  Transportation Needs: Not on file  Physical Activity: Not on file  Stress: Not on file  Social Connections: Not on file    His Allergies Are:  No Known Allergies:   His Current Medications Are:  Outpatient Encounter Medications as of 01/30/2021  Medication Sig   atorvastatin (LIPITOR) 20 MG tablet TAKE 1 TABLET BY MOUTH EVERY  DAY   hydrochlorothiazide (HYDRODIURIL) 25 MG tablet TAKE 1 TABLET BY MOUTH EVERY DAY   losartan (COZAAR) 100 MG tablet TAKE 1 TABLET BY MOUTH EVERY DAY   Multiple Vitamin (MULTIVITAMIN WITH MINERALS) TABS tablet Take 1 tablet by mouth daily.   omeprazole (PRILOSEC) 40 MG capsule TAKE 1 CAPSULE BY MOUTH EVERY DAY   tadalafil (CIALIS) 20 MG tablet TAKE 1/2 TO 1 (ONE-HALF TO ONE) TABLET BY MOUTH EVERY OTHER DAY AS NEEDED FOR ERECTILE DYSFUNCTION   XIIDRA 5 % SOLN Place 1 drop into both eyes 2 (two) times daily.   No facility-administered encounter medications on file as of 01/30/2021.  :   Review of Systems:  Out of a complete 14 point review of systems, all are reviewed and negative with the exception of these symptoms as listed below:  Review of Systems  Neurological:        Pt is here for sleep consult . Pt states he does snores, wakes up with headaches, with some fatigue throughout the day. Pt states he has Hypertension.Pt denies sleep study and CPAP at home . Pt states symptoms have worsens since he has gained weight. Pt states he has gained  40lbs in the last 2 years . Pt states girlfriend has witness him stop breathing during the night   ESS: 17 FSS:33   Objective:  Neurological Exam  Physical Exam Physical Examination:   Vitals:   01/30/21 1119  BP: (!) 143/86  Pulse: 70    General Examination: The patient is a very pleasant 50 y.o. male in no acute distress. He appears well-developed and well-nourished and well groomed.   HEENT: Normocephalic, atraumatic, pupils are equal, round and reactive to light, extraocular tracking is good without limitation to gaze excursion or nystagmus noted. Hearing is grossly intact. Face is symmetric with normal facial animation. Speech is clear with no dysarthria noted. There is no hypophonia. There is no lip, neck/head, jaw or voice tremor. Neck is supple with full range of passive and active motion. There are no carotid bruits on  auscultation. Oropharynx exam reveals: mild mouth dryness, adequate dental hygiene and mild airway crowding, due to a small airway entry.  Mallampati class III, tonsils absent, small uvula noted.  Tongue protrudes centrally and palate elevates symmetrically.  Neck circumference of 20 three-quarter inches.  He has a mild to moderate overbite.  Chest: Clear to auscultation without wheezing, rhonchi or crackles noted.  Heart: S1+S2+0, regular and normal without murmurs, rubs or gallops noted.   Abdomen: Soft, non-tender and non-distended with normal bowel sounds appreciated on auscultation.  Extremities: There is trace pitting edema, more noticeable on the right side in the distal  lower extremities bilaterally.    Skin: Warm and dry without trophic changes noted.   Musculoskeletal: exam reveals no obvious joint deformities.   Neurologically:  Mental status: The patient is awake, alert and oriented in all 4 spheres. His immediate and remote memory, attention, language skills and fund of knowledge are appropriate. There is no evidence of aphasia, agnosia, apraxia or anomia. Speech is clear with normal prosody and enunciation. Thought process is linear. Mood is normal and affect is normal.  Cranial nerves II - XII are as described above under HEENT exam.  Motor exam: Normal bulk, strength and tone is noted. There is no tremor, fine motor skills and coordination: grossly intact.  Cerebellar testing: No dysmetria or intention tremor. There is no truncal or gait ataxia.  Sensory exam: intact to light touch in the upper and lower extremities.  Gait, station and balance: He stands easily. No veering to one side is noted. No leaning to one side is noted. Posture is age-appropriate and stance is narrow based. Gait shows normal stride length and normal pace. No problems turning are noted.   Assessment and Plan:  In summary, Travis Sanders is a very pleasant 50 y.o.-year old male with an underlying  medical history of hypertension, hyperlipidemia, reflux disease, and severe obesity with a BMI of over 40, whose history and physical exam are concerning for obstructive sleep apnea (OSA). I had a long chat with the patient about my findings and the diagnosis of OSA, its prognosis and treatment options. We talked about medical treatments, surgical interventions and non-pharmacological approaches. I explained in particular the risks and ramifications of untreated moderate to severe OSA, especially with respect to developing cardiovascular disease down the Road, including congestive heart failure, difficult to treat hypertension, cardiac arrhythmias, or stroke. Even type 2 diabetes has, in part, been linked to untreated OSA. Symptoms of untreated OSA include daytime sleepiness, memory problems, mood irritability and mood disorder such as depression and anxiety, lack of energy, as well as recurrent headaches, especially morning headaches. We talked about trying to maintain a healthy lifestyle in general, as well as the importance of weight control. We also talked about the importance of good sleep hygiene. I recommended the following at this time: sleep study.  I outlined the difference between a laboratory attended sleep study versus home sleep test.  I explained the sleep test procedure to the patient and also outlined possible surgical and non-surgical treatment options of OSA, including the use of a custom-made dental device (which would require a referral to a specialist dentist or oral surgeon), upper airway surgical options, such as traditional UPPP or a novel less invasive surgical option in the form of Inspire hypoglossal nerve stimulation (which would involve a referral to an ENT surgeon). I also explained the CPAP treatment option to the patient, who indicated that he would be willing to try CPAP if the need arises. I explained the importance of being compliant with PAP treatment, not only for insurance  purposes but primarily to improve His symptoms, and for the patient's long term health benefit, including to reduce His cardiovascular risks. I answered all his questions today and the patient was in agreement. I plan to see him back after the sleep study is completed and encouraged him to call with any interim questions, concerns, problems or updates.   Thank you very much for allowing me to participate in the care of this nice patient. If I can be of any further assistance to you please  do not hesitate to call me at [phone].  Sincerely,   Star Age, MD, PhD

## 2021-02-16 ENCOUNTER — Other Ambulatory Visit: Payer: Self-pay | Admitting: Family Medicine

## 2021-02-18 ENCOUNTER — Other Ambulatory Visit: Payer: Self-pay | Admitting: Family Medicine

## 2021-03-24 ENCOUNTER — Other Ambulatory Visit: Payer: Self-pay | Admitting: Family Medicine

## 2021-03-30 ENCOUNTER — Other Ambulatory Visit: Payer: Self-pay | Admitting: Family Medicine

## 2021-04-18 ENCOUNTER — Other Ambulatory Visit: Payer: Self-pay | Admitting: Family Medicine

## 2021-04-24 ENCOUNTER — Other Ambulatory Visit: Payer: Self-pay | Admitting: Family Medicine

## 2021-04-25 NOTE — Telephone Encounter (Signed)
Cialis refill request, last seen 11/03/2020, last filled 03/24/2021

## 2021-05-01 ENCOUNTER — Ambulatory Visit (INDEPENDENT_AMBULATORY_CARE_PROVIDER_SITE_OTHER): Payer: No Typology Code available for payment source | Admitting: Neurology

## 2021-05-01 DIAGNOSIS — G4733 Obstructive sleep apnea (adult) (pediatric): Secondary | ICD-10-CM

## 2021-05-01 DIAGNOSIS — R635 Abnormal weight gain: Secondary | ICD-10-CM

## 2021-05-01 DIAGNOSIS — K219 Gastro-esophageal reflux disease without esophagitis: Secondary | ICD-10-CM

## 2021-05-01 DIAGNOSIS — G4719 Other hypersomnia: Secondary | ICD-10-CM

## 2021-05-01 DIAGNOSIS — R0681 Apnea, not elsewhere classified: Secondary | ICD-10-CM

## 2021-05-01 DIAGNOSIS — G4734 Idiopathic sleep related nonobstructive alveolar hypoventilation: Secondary | ICD-10-CM

## 2021-05-01 DIAGNOSIS — R519 Headache, unspecified: Secondary | ICD-10-CM

## 2021-05-01 DIAGNOSIS — R0683 Snoring: Secondary | ICD-10-CM

## 2021-05-03 NOTE — Addendum Note (Signed)
Addended by: Star Age on: 05/03/2021 05:07 PM   Modules accepted: Orders

## 2021-05-03 NOTE — Progress Notes (Signed)
°  ° °  [facility]  HOME SLEEP TEST (Watch PAT) REPORT  STUDY DATE: 05/01/2021  DOB: 07/29/1970  MRN: [id number]  ORDERING CLINICIAN: Star Age, MD, PhD   REFERRING CLINICIAN: Susy Frizzle, MD   CLINICAL INFORMATION/HISTORY: 51 year old with an underlying medical history of hypertension, hyperlipidemia, reflux disease, and severe obesity with a BMI of over 40, who reports snoring and excessive daytime somnolence as well as witnessed apneas   Epworth sleepiness score: 17/24.  BMI: 41.1 kg/m  FINDINGS:   Sleep Summary:   Total Recording Time (hours, min): 7 hours, 19 minutes  Total Sleep Time (hours, min):  7 hours, 3 minutes   Percent REM (%):    20.3%   Respiratory Indices:   Calculated pAHI (per hour):  95.6/hour         REM pAHI:    71.5/hour       NREM pAHI: 101.4/hour  Oxygen Saturation Statistics:    Oxygen Saturation (%) Mean: 86%   Minimum oxygen saturation (%):                 51%   O2 Saturation Range (%): 51-99%    O2 Saturation (minutes) <=88%: 199.6 min  Pulse Rate Statistics:   Pulse Mean (bpm):    67/min    Pulse Range (36-110/min)   IMPRESSION: OSA (obstructive sleep apnea), severe Nocturnal hypoxemia  RECOMMENDATION:  This home sleep test demonstrates rather severe obstructive sleep apnea with a total AHI of 95.6/hour and O2 nadir of 51% with repeated critical desaturations and significant time below 89% saturation of nearly 200 minutes for the night, nearly 50% of the time tested with mild snoring detected throughout the night. Urgent treatment with positive airway pressure is highly recommended. The patient will be advised to proceed with an autoPAP titration/trial at home.  A laboratory attended titration study should be considered in the near future for optimization of his treatment and better tolerance of therapy.  Alternative treatment options are limited secondary to the severity of the patient's sleep disordered  breathing.  Concomitant weight loss is highly recommended.  Please note, that untreated obstructive sleep apnea may carry additional perioperative morbidity. Patients with significant obstructive sleep apnea should receive perioperative PAP therapy and the surgeons and particularly the anesthesiologist should be informed of the diagnosis and the severity of the sleep disordered breathing. The patient should be cautioned not to drive, work at heights, or operate dangerous or heavy equipment when tired or sleepy. Review and reiteration of good sleep hygiene measures should be pursued with any patient. Other causes of the patient's symptoms, including circadian rhythm disturbances, an underlying mood disorder, medication effect and/or an underlying medical problem cannot be ruled out based on this test. Clinical correlation is recommended. The patient and his referring provider will be notified of the test results. The patient will be seen in follow up in sleep clinic at [facility].  I certify that I have reviewed the raw data recording prior to the issuance of this report in accordance with the standards of the American Academy of Sleep Medicine (AASM).  INTERPRETING PHYSICIAN:   Star Age, MD, PhD  Board Certified in Neurology and Sleep Medicine  [facility] [address], [place],  [address] [phone]

## 2021-05-03 NOTE — Procedures (Signed)
°  ° °  [facility]  HOME SLEEP TEST (Watch PAT) REPORT  STUDY DATE: 05/01/2021  DOB: 01/27/1971  MRN: [id number]  ORDERING CLINICIAN: Star Age, MD, PhD   REFERRING CLINICIAN: Susy Frizzle, MD   CLINICAL INFORMATION/HISTORY: 52 year old with an underlying medical history of hypertension, hyperlipidemia, reflux disease, and severe obesity with a BMI of over 40, who reports snoring and excessive daytime somnolence as well as witnessed apneas   Epworth sleepiness score: 17/24.  BMI: 41.1 kg/m  FINDINGS:   Sleep Summary:   Total Recording Time (hours, min): 7 hours, 19 minutes  Total Sleep Time (hours, min):  7 hours, 3 minutes   Percent REM (%):    20.3%   Respiratory Indices:   Calculated pAHI (per hour):  95.6/hour         REM pAHI:    71.5/hour       NREM pAHI: 101.4/hour  Oxygen Saturation Statistics:    Oxygen Saturation (%) Mean: 86%   Minimum oxygen saturation (%):                 51%   O2 Saturation Range (%): 51-99%    O2 Saturation (minutes) <=88%: 199.6 min  Pulse Rate Statistics:   Pulse Mean (bpm):    67/min    Pulse Range (36-110/min)   IMPRESSION: OSA (obstructive sleep apnea), severe Nocturnal hypoxemia  RECOMMENDATION:  This home sleep test demonstrates rather severe obstructive sleep apnea with a total AHI of 95.6/hour and O2 nadir of 51% with repeated critical desaturations and significant time below 89% saturation of nearly 200 minutes for the night, nearly 50% of the time tested with mild snoring detected throughout the night. Urgent treatment with positive airway pressure is highly recommended. The patient will be advised to proceed with an autoPAP titration/trial at home.  A laboratory attended titration study should be considered in the near future for optimization of his treatment and better tolerance of therapy.  Alternative treatment options are limited secondary to the severity of the patient's sleep disordered  breathing.  Concomitant weight loss is highly recommended.  Please note, that untreated obstructive sleep apnea may carry additional perioperative morbidity. Patients with significant obstructive sleep apnea should receive perioperative PAP therapy and the surgeons and particularly the anesthesiologist should be informed of the diagnosis and the severity of the sleep disordered breathing. The patient should be cautioned not to drive, work at heights, or operate dangerous or heavy equipment when tired or sleepy. Review and reiteration of good sleep hygiene measures should be pursued with any patient. Other causes of the patient's symptoms, including circadian rhythm disturbances, an underlying mood disorder, medication effect and/or an underlying medical problem cannot be ruled out based on this test. Clinical correlation is recommended. The patient and his referring provider will be notified of the test results. The patient will be seen in follow up in sleep clinic at [facility].  I certify that I have reviewed the raw data recording prior to the issuance of this report in accordance with the standards of the American Academy of Sleep Medicine (AASM).  INTERPRETING PHYSICIAN:   Star Age, MD, PhD  Board Certified in Neurology and Sleep Medicine  [facility] [address], [place], [place] [address] [phone]

## 2021-05-04 ENCOUNTER — Encounter: Payer: Self-pay | Admitting: Family Medicine

## 2021-05-04 ENCOUNTER — Encounter: Payer: Self-pay | Admitting: *Deleted

## 2021-05-04 ENCOUNTER — Telehealth: Payer: Self-pay | Admitting: *Deleted

## 2021-05-04 NOTE — Telephone Encounter (Signed)
-----   Message from Star Age, MD sent at 05/03/2021  5:07 PM EST ----- Patient referred by Dr. Dennard Schaumann, seen by me on 01/30/2021, patient had a HST on 05/01/2021.    Please call and notify the patient that the recent home sleep test showed obstructive sleep apnea in the rather severe range, with critical desaturations. I recommend treatment ASAP for this in the form of autoPAP, which means, that we don't have to bring him in for a sleep study with CPAP, but will let him start using a so called autoPAP machine at home, through a DME company (of his choice, or as per insurance requirement). The DME representative will fit the patient with a mask of choice, educate him on how to use the machine, how to put the mask on, etc. I have placed an order in the chart. Please send the order to a local DME, talk to patient, send report to referring MD. Please also reinforce the need for compliance with treatment. We will need a FU in sleep clinic for 10 weeks post-PAP set up, please arrange that with me or one of our NPs. Thanks,    Please send the order urgently. Star Age, MD, PhD [facility])

## 2021-05-04 NOTE — Telephone Encounter (Signed)
I called pt. I advised pt that Dr. Rexene Alberts reviewed their sleep study results and found that pt has severe OSA with critical desaturations. Dr. Rexene Alberts recommends that pt start autopap ASAP. I reviewed PAP compliance expectations with the pt. Pt is agreeable to starting an auto-PAP. I advised pt that an order will be sent to a DME, [facility], and they will call the pt within about one week after they file with the pt's insurance. [facility] will show the pt how to use the machine, fit for masks, and troubleshoot the auto-PAP if needed. A follow up appt was made for insurance purposes with Dr. Francesco Sor on 07-04-2021 at [place]. Pt verbalized understanding to arrive 15 minutes early and bring their auto-PAP. A letter with all of this information in it will be mailed to the pt as a reminder. I verified with the pt that the address we have on file is correct. Pt verbalized understanding of results. Pt had no questions at this time but was encouraged to call back if questions arise. I have sent the order to [facility] and have received confirmation that they have received the order.

## 2021-05-16 ENCOUNTER — Other Ambulatory Visit: Payer: Self-pay | Admitting: Family Medicine

## 2021-06-21 ENCOUNTER — Other Ambulatory Visit: Payer: Self-pay | Admitting: Family Medicine

## 2021-06-28 ENCOUNTER — Other Ambulatory Visit: Payer: Self-pay | Admitting: Family Medicine

## 2021-07-04 ENCOUNTER — Ambulatory Visit (INDEPENDENT_AMBULATORY_CARE_PROVIDER_SITE_OTHER): Payer: No Typology Code available for payment source | Admitting: Neurology

## 2021-07-04 ENCOUNTER — Encounter: Payer: Self-pay | Admitting: Neurology

## 2021-07-04 VITALS — BP 137/74 | HR 68 | Ht 72.0 in | Wt 312.4 lb

## 2021-07-04 DIAGNOSIS — G4734 Idiopathic sleep related nonobstructive alveolar hypoventilation: Secondary | ICD-10-CM

## 2021-07-04 DIAGNOSIS — G4733 Obstructive sleep apnea (adult) (pediatric): Secondary | ICD-10-CM

## 2021-07-04 DIAGNOSIS — Z9989 Dependence on other enabling machines and devices: Secondary | ICD-10-CM | POA: Diagnosis not present

## 2021-07-04 NOTE — Progress Notes (Signed)
Subjective:  ?  ?Patient ID: Travis Sanders is a 51 y.o. male. ? ?HPI ? ? ? ?Interim history:  ? ?Travis Sanders is a 51 year old with an underlying medical history of hypertension, hyperlipidemia, reflux disease, and severe obesity with a BMI of over 40, who presents for follow-up consultation of Travis Sanders obstructive sleep apnea after interim testing and starting AutoPap therapy.  The patient is unaccompanied today.  I first met him at the request of Travis Sanders primary care physician on 01/30/2021, at which time he reported snoring and excessive daytime somnolence with an Epworth sleepiness score of 17, as well as witnessed apneas reported for girlfriend's feedback.  He was advised to proceed with sleep testing.  He had a home sleep test on 05/01/2021 which indicated severe obstructive sleep apnea with an AHI of 95.6/h, O2 nadir 51% with repeated critical desaturations and significant time below 89% saturation of nearly 200 minutes.  He was advised to start AutoPap therapy at home.  Travis Sanders set up date was 05/05/2021.  He has a ResMed AirSense 10 AutoSet machine. ? ?Today, 07/04/2021: I reviewed Travis Sanders AutoPap compliance data from 05/05/2021 through 06/03/2021, which is a total of 30 days, during which time he used Travis Sanders machine every night with percent use days greater than 4 hours at 100%, indicating superb compliance with an average usage of 6 hours and 24 minutes, residual AHI is mildly elevated at 7.8/h, 95th percentile of pressure at 15.7 cm with a range of 9 to 16 cm with EPR.  Leak rather low with the 95th percentile at 0.4 L/min.  No significant central respiratory events detected.  He has been very compliant throughout the last 2 months.  He is overall AHI of for the past 60 days has improved to 5.4/h and average.  He reports doing well with Travis Sanders AutoPap machine.  He feels quite improved with regards to Travis Sanders sleep quality, sleep consolidation, daytime somnolence, headaches, and particularly Travis Sanders reflux symptoms since starting  AutoPap therapy.  He is very motivated to continue with treatment.  Per girlfriend, he no longer snores.  Travis Sanders weight has been more or less stable.  He has been using a fullface mask which is the under the nose kind.  In the last 30 days Travis Sanders AHI has averaged less than 5/h.  We also pulled the data from Travis Sanders machine for the last 30 days and Travis Sanders average AHI has been 2.8/h, average central respiratory index at 0.3/h, average pressure for the 95th percentile at 15.3 cm.  He had a cold recently and had difficulty tolerating treatment at the time, this explains the few days with usage less than 4 hours. ? ?The patient's allergies, current medications, family history, past medical history, past social history, past surgical history and problem list were reviewed and updated as appropriate.  ? ?Previously:  ? ?01/30/21: (He) reports snoring and excessive daytime somnolence as well as witnessed apneas per girlfriend's report.  I reviewed your office note from 11/03/2020.  Travis Sanders Epworth sleepiness score is 17 out of 24, fatigue severity score is 33 out of 63.  He has had significant weight gain over the past few years.  Travis Sanders snoring has become worse and he no longer is able to sleep on Travis Sanders back.  He prefers to sleep on Travis Sanders stomach.  He is divorced, he lives alone, Travis Sanders 73 year old son is there half the time.  Patient denies any night to night nocturia but has had morning headaches which are typically in the bifrontal area, dull  and achy.  He does not have a history of migraines.  He has reflux symptoms at night.  He is not aware of any family history of sleep apnea.  He denies any telltale symptoms of restless leg syndrome or twitching in Travis Sanders feet or legs at night.  He drinks caffeine in the form of coffee, 1 cup in the morning, typically no day-to-day soda or tea.  He drinks alcohol occasionally in the form of beer, 1 a week on average.  He is a non-smoker.  He has a TV in the bedroom and watches some TV before falling asleep but  turns it off before falling asleep.  Bedtime is generally between 11:30 PM and 12:30 AM and rise time is around 6:15 AM.  He works for [employer], has a sedentary job.  He does get sleepy at Travis Sanders desk but has not dozed off at the wheel.  He does become sleepy sometimes while driving, he has a fairly long commute of 47 miles. ? ?Travis Sanders Past Medical History Is Significant For: ?Past Medical History:  ?Diagnosis Date  ? GERD (gastroesophageal reflux disease)   ? Hyperlipidemia   ? Hypertension   ? OSA (obstructive sleep apnea)   ? ? ?Travis Sanders Past Surgical History Is Significant For: ?Past Surgical History:  ?Procedure Laterality Date  ? COLONOSCOPY N/A 11/05/2019  ? Procedure: COLONOSCOPY;  Surgeon: Rogene Houston, MD;  [facility];  Service: Endoscopy;  Laterality: N/A;  1030  ? KNEE ARTHROSCOPY    ? POLYPECTOMY  11/05/2019  ? Procedure: POLYPECTOMY;  Surgeon: Rogene Houston, MD;  [facility]: [facility];  Service: Endoscopy;;  ? TONSILLECTOMY    ? ? ?Travis Sanders Family History Is Significant For: ?Family History  ?Problem Relation Age of Onset  ? Sleep apnea Neg Hx   ? ? ?Travis Sanders Social History Is Significant For: ?Social History  ? ?Socioeconomic History  ? Marital status: Married  ?  Spouse name: Not on file  ? Number of children: Not on file  ? Years of education: Not on file  ? Highest education level: Not on file  ?Occupational History  ? Not on file  ?Tobacco Use  ? Smoking status: Never  ? Smokeless tobacco: Never  ?Substance and Sexual Activity  ? Alcohol use: Yes  ?  Alcohol/week: 3.0 standard drinks  ?  Types: 3 Cans of beer per week  ?  Comment: occasionally  ? Drug use: No  ? Sexual activity: Yes  ?Other Topics Concern  ? Not on file  ?Social History Narrative  ? Not on file  ? ?Social Determinants of Health  ? ?Financial Resource Strain: Not on file  ?Food Insecurity: Not on file  ?Transportation Needs: Not on file  ?Physical Activity: Not on file  ?Stress: Not on file  ?Social Connections: Not  on file  ? ? ?Travis Sanders Allergies Are:  ?No Known Allergies:  ? ?Travis Sanders Current Medications Are:  ?Outpatient Encounter Medications as of 07/04/2021  ?Medication Sig  ? atorvastatin (LIPITOR) 20 MG tablet TAKE 1 TABLET BY MOUTH EVERY DAY  ? hydrochlorothiazide (HYDRODIURIL) 25 MG tablet TAKE 1 TABLET BY MOUTH EVERY DAY  ? losartan (COZAAR) 100 MG tablet TAKE 1 TABLET BY MOUTH EVERY DAY  ? Multiple Vitamin (MULTIVITAMIN WITH MINERALS) TABS tablet Take 1 tablet by mouth daily.  ? omeprazole (PRILOSEC) 40 MG capsule TAKE 1 CAPSULE BY MOUTH EVERY DAY  ? tadalafil (CIALIS) 20 MG tablet TAKE 1/2 TO 1 (ONE-HALF TO ONE)  TABLET BY MOUTH EVERY OTHER DAY AS NEEDED FOR ERECTILE DYSFUNCTION  ? XIIDRA 5 % SOLN Place 1 drop into both eyes 2 (two) times daily.  ? ?No facility-administered encounter medications on file as of 07/04/2021.  ?: ? ?Review of Systems:  ?Out of a complete 14 point review of systems, all are reviewed and negative with the exception of these symptoms as listed below:  ? ?Review of Systems  ?Neurological:   ?     Initial cpap f/u.  Had one mask change is better. Also noted,  woken several times with mask off, no aware he did that.   DEM [place].   ? ?Objective:  ?Neurological Exam ? ?Physical Exam ?Physical Examination:  ? ?Vitals:  ? 07/04/21 0851  ?BP: 137/74  ?Pulse: 68  ? ? ?General Examination: The patient is a very pleasant 51 y.o. male in no acute distress. He appears well-developed and well-nourished and well groomed.  ? ?HEENT: Normocephalic, atraumatic, pupils are equal, round and reactive to light, extraocular tracking is well-preserved, speech is clear without hypophonia, or dysarthria.  Neck is supple with full range of motion, no carotid bruits.  Airway examination reveals mild mouth dryness, tongue protrudes centrally and palate elevates symmetrically.  He has a small airway entry.   ?  ?Chest: Clear to auscultation without wheezing, rhonchi or crackles noted. ?  ?Heart: S1+S2+0, regular and normal  without murmurs, rubs or gallops noted.  ?  ?Abdomen: Soft, non-tender and non-distended. ?  ?Extremities: There is no obvious pitting edema.   ?  ?Skin: Warm and dry without trophic changes noted.  ?  ?Muscul

## 2021-07-04 NOTE — Patient Instructions (Signed)
It was nice to see you again today. I am glad you are feeling better with your autoPAP and you are fully compliant, nearly 100%.  ?Please continue using your autoPAP regularly. While your insurance requires that you use PAP at least 4 hours each night on 70% of the nights, I recommend, that you not skip any nights and use it throughout the night if you can. Getting used to PAP and staying with the treatment long term does take time and patience and discipline. Untreated obstructive sleep apnea when it is moderate to severe can have an adverse impact on cardiovascular health and raise her risk for heart disease, arrhythmias, hypertension, congestive heart failure, stroke and diabetes. Untreated obstructive sleep apnea causes sleep disruption, nonrestorative sleep, and sleep deprivation. This can have an impact on your day to day functioning and cause daytime sleepiness and impairment of cognitive function, memory loss, mood disturbance, and problems focussing. Using PAP regularly can improve these symptoms. ?Your apnea scores look good.  ?As discussed, we will do an overnight oxygen level test, called ONO, and your DME company will call and set this up for one night, while you also use your autoPAP as usual. We will call you with the results. This is to make sure that your oxygen levels stay in the 90s, while you are treated with autoPAP for your OSA. Remember, your oxygen levels dropped into the 50s during the home sleep test.  ? ? ? ? ?

## 2021-08-16 ENCOUNTER — Other Ambulatory Visit: Payer: Self-pay | Admitting: Family Medicine

## 2021-08-16 NOTE — Telephone Encounter (Signed)
Requested medication (s) are due for refill today: yes  Requested medication (s) are on the active medication list: yes  Last refill:  both refilled 05/17/21 #30 2 RF  Future visit scheduled: Called pt and LM on VM to call office to schedule appointment  Notes to clinic:  needs appt/overdue lab work   Requested Prescriptions  Pending Prescriptions Disp Refills   losartan (COZAAR) 100 MG tablet [Pharmacy Med Name: LOSARTAN POTASSIUM 100 MG TAB] 90 tablet     Sig: TAKE 1 TABLET BY MOUTH EVERY DAY     Cardiovascular:  Angiotensin Receptor Blockers Failed - 08/16/2021  1:48 AM      Failed - Cr in normal range and within 180 days    Creat  Date Value Ref Range Status  10/27/2020 1.05 0.70 - 1.30 mg/dL Final         Failed - K in normal range and within 180 days    Potassium  Date Value Ref Range Status  10/27/2020 4.1 3.5 - 5.3 mmol/L Final         Failed - Valid encounter within last 6 months    Recent Outpatient Visits           9 months ago Hypersomnolence   [place] Pickard, Cammie Mcgee, MD   2 years ago Colon cancer screening   [place] Susy Frizzle, MD   3 years ago Benign essential HTN   [place] Schaumann, Cammie Mcgee, MD   4 years ago Routine general medical examination at a health care facility   [place], Cammie Mcgee, MD   5 years ago Acute pharyngitis, unspecified etiology   [place], Modena Nunnery, MD               Passed - Patient is not pregnant      Passed - Last BP in normal range    BP Readings from Last 1 Encounters:  07/04/21 137/74          omeprazole (PRILOSEC) 40 MG capsule [Pharmacy Med Name: OMEPRAZOLE DR 40 MG CAPSULE] 90 capsule     Sig: TAKE 1 [place]     Gastroenterology: Proton Pump Inhibitors Failed - 08/16/2021  1:48 AM      Failed - Valid encounter within last 12 months    Recent Outpatient Visits            9 months ago Hypersomnolence   [place] Pickard, Cammie Mcgee, MD   2 years ago Colon cancer screening   [place] Susy Frizzle, MD   3 years ago Benign essential HTN   [place] Dennard Schaumann, Cammie Mcgee, MD   4 years ago Routine general medical examination at a health care facility   [place], Cammie Mcgee, MD   5 years ago Acute pharyngitis, unspecified etiology   [place], Modena Nunnery, MD

## 2021-09-08 ENCOUNTER — Ambulatory Visit (INDEPENDENT_AMBULATORY_CARE_PROVIDER_SITE_OTHER): Payer: No Typology Code available for payment source | Admitting: Family Medicine

## 2021-09-08 VITALS — BP 150/76 | HR 86 | Temp 99.0°F | Ht 73.0 in | Wt 316.0 lb

## 2021-09-08 DIAGNOSIS — E785 Hyperlipidemia, unspecified: Secondary | ICD-10-CM | POA: Diagnosis not present

## 2021-09-08 DIAGNOSIS — Z125 Encounter for screening for malignant neoplasm of prostate: Secondary | ICD-10-CM

## 2021-09-08 DIAGNOSIS — I10 Essential (primary) hypertension: Secondary | ICD-10-CM | POA: Diagnosis not present

## 2021-09-08 MED ORDER — LOSARTAN POTASSIUM 100 MG PO TABS
100.0000 mg | ORAL_TABLET | Freq: Every day | ORAL | 3 refills | Status: DC
Start: 2021-09-08 — End: 2022-08-30

## 2021-09-08 MED ORDER — AMLODIPINE BESYLATE 5 MG PO TABS
5.0000 mg | ORAL_TABLET | Freq: Every day | ORAL | 3 refills | Status: DC
Start: 2021-09-08 — End: 2022-08-30

## 2021-09-08 MED ORDER — HYDROCHLOROTHIAZIDE 25 MG PO TABS: 25.0000 mg | ORAL_TABLET | Freq: Every day | ORAL | 3 refills | Status: DC

## 2021-09-08 MED ORDER — ATORVASTATIN CALCIUM 20 MG PO TABS: 20.0000 mg | ORAL_TABLET | Freq: Every day | ORAL | 3 refills | Status: DC

## 2021-09-08 MED ORDER — TADALAFIL 20 MG PO TABS: ORAL_TABLET | ORAL | 11 refills | Status: DC

## 2021-09-09 ENCOUNTER — Other Ambulatory Visit: Payer: Self-pay | Admitting: Family Medicine

## 2021-09-11 NOTE — Telephone Encounter (Signed)
Patient is requesting a 90 day supply on this medication. He was last seen on 09/08/2021

## 2021-09-25 ENCOUNTER — Other Ambulatory Visit: Payer: No Typology Code available for payment source

## 2021-09-25 DIAGNOSIS — E782 Mixed hyperlipidemia: Secondary | ICD-10-CM

## 2021-09-28 LAB — COMPREHENSIVE METABOLIC PANEL
AG Ratio: 1.6 (calc) (ref 1.0–2.5)
ALT: 36 U/L (ref 9–46)
AST: 19 U/L (ref 10–35)
Albumin: 4.2 g/dL (ref 3.6–5.1)
Alkaline phosphatase (APISO): 109 U/L (ref 35–144)
BUN/Creatinine Ratio: 24 (calc) — ABNORMAL HIGH (ref 6–22)
BUN: 26 mg/dL — ABNORMAL HIGH (ref 7–25)
CO2: 28 mmol/L (ref 20–32)
Calcium: 9.2 mg/dL (ref 8.6–10.3)
Chloride: 105 mmol/L (ref 98–110)
Creat: 1.09 mg/dL (ref 0.70–1.30)
Globulin: 2.6 g/dL (calc) (ref 1.9–3.7)
Glucose, Bld: 119 mg/dL — ABNORMAL HIGH (ref 65–99)
Potassium: 4.2 mmol/L (ref 3.5–5.3)
Sodium: 142 mmol/L (ref 135–146)
Total Bilirubin: 0.4 mg/dL (ref 0.2–1.2)
Total Protein: 6.8 g/dL (ref 6.1–8.1)

## 2021-09-28 LAB — LIPID PANEL
Cholesterol: 157 mg/dL (ref <200)
HDL: 50 mg/dL (ref >=40)
LDL Cholesterol (Calc): 92 mg/dL (calc)
Non-HDL Cholesterol (Calc): 107 mg/dL (calc) (ref <130)
Total CHOL/HDL Ratio: 3.1 (calc) (ref <5.0)
Triglycerides: 67 mg/dL (ref <150)

## 2021-09-28 LAB — HEMOGLOBIN A1C W/OUT EAG: Hgb A1c MFr Bld: 6 % of total Hgb — ABNORMAL HIGH (ref <5.7)

## 2021-09-28 LAB — CBC WITH DIFFERENTIAL/PLATELET
Absolute Monocytes: 365 cells/uL (ref 200–950)
Basophils Absolute: 29 cells/uL (ref 0–200)
Basophils Relative: 0.5 %
Eosinophils Absolute: 174 cells/uL (ref 15–500)
Eosinophils Relative: 3 %
HCT: 41.2 % (ref 38.5–50.0)
Hemoglobin: 13.9 g/dL (ref 13.2–17.1)
Lymphs Abs: 2053 cells/uL (ref 850–3900)
MCH: 31 pg (ref 27.0–33.0)
MCHC: 33.7 g/dL (ref 32.0–36.0)
MCV: 91.8 fL (ref 80.0–100.0)
MPV: 9.5 fL (ref 7.5–12.5)
Monocytes Relative: 6.3 %
Neutro Abs: 3178 cells/uL (ref 1500–7800)
Neutrophils Relative %: 54.8 %
Platelets: 307 10*3/uL (ref 140–400)
RBC: 4.49 10*6/uL (ref 4.20–5.80)
RDW: 13.1 % (ref 11.0–15.0)
Total Lymphocyte: 35.4 %
WBC: 5.8 10*3/uL (ref 3.8–10.8)

## 2021-09-28 LAB — TEST AUTHORIZATION

## 2022-07-10 ENCOUNTER — Ambulatory Visit: Payer: No Typology Code available for payment source | Admitting: Neurology

## 2022-08-30 ENCOUNTER — Other Ambulatory Visit: Payer: Self-pay | Admitting: Family Medicine

## 2022-08-30 NOTE — Telephone Encounter (Signed)
Requested Prescriptions  Pending Prescriptions Disp Refills   amLODipine (NORVASC) 5 MG tablet [Pharmacy Med Name: AMLODIPINE BESYLATE 5 MG TAB] 90 tablet 0    Sig: TAKE 1 TABLET (5 MG TOTAL) BY MOUTH DAILY.     Cardiovascular: Calcium Channel Blockers 2 Failed - 08/30/2022  2:35 AM      Failed - Last BP in normal range    BP Readings from Last 1 Encounters:  09/08/21 (!) 150/76         Failed - Valid encounter within last 6 months    Recent Outpatient Visits           1 year ago Hypersomnolence   [facility] Tanya Nones, Priscille Heidelberg, MD   3 years ago Colon cancer screening   [facility] Donita Brooks, MD   4 years ago Benign essential HTN   [facility] Tanya Nones, Priscille Heidelberg, MD   5 years ago Routine general medical examination at a health care facility   [facility] Pickard, Priscille Heidelberg, MD   6 years ago Acute pharyngitis, unspecified etiology   [facility] [place], Velna Hatchet, MD       Future Appointments             In 4 weeks Tanya Nones, Priscille Heidelberg, MD [facility], [facility]            Passed - Last Heart Rate in normal range    Pulse Readings from Last 1 Encounters:  09/08/21 86          losartan (COZAAR) 100 MG tablet [Pharmacy Med Name: LOSARTAN POTASSIUM 100 MG TAB] 90 tablet 0    Sig: TAKE 1 TABLET BY MOUTH EVERY DAY     Cardiovascular:  Angiotensin Receptor Blockers Failed - 08/30/2022  2:35 AM      Failed - Cr in normal range and within 180 days    Creat  Date Value Ref Range Status  09/25/2021 1.09 0.70 - 1.30 mg/dL Final         Failed - K in normal range and within 180 days    Potassium  Date Value Ref Range Status  09/25/2021 4.2 3.5 - 5.3 mmol/L Final         Failed - Last BP in normal range    BP Readings from Last 1 Encounters:  09/08/21 (!) 150/76         Failed - Valid encounter within last 6 months    Recent Outpatient Visits           1  year ago Hypersomnolence   [place] [facility] Donita Brooks, MD   3 years ago Colon cancer screening   [facility] Donita Brooks, MD   4 years ago Benign essential HTN   [facility] Tanya Nones, Priscille Heidelberg, MD   5 years ago Routine general medical examination at a health care facility   [facility] Pickard, Priscille Heidelberg, MD   6 years ago Acute pharyngitis, unspecified etiology   [facility] Medicine [place], Velna Hatchet, MD       Future Appointments             In 4 weeks Pickard, Priscille Heidelberg, MD [facility], [facility]            Passed - Patient is not pregnant       atorvastatin (  LIPITOR) 20 MG tablet [Pharmacy Med Name: ATORVASTATIN 20 MG TABLET] 90 tablet 0    Sig: TAKE 1 TABLET BY MOUTH EVERY DAY     Cardiovascular:  Antilipid - Statins Failed - 08/30/2022  2:35 AM      Failed - Valid encounter within last 12 months    Recent Outpatient Visits           1 year ago Hypersomnolence   [facility] Pickard, Priscille Heidelberg, MD   3 years ago Colon cancer screening   [facility] Donita Brooks, MD   4 years ago Benign essential HTN   [facility] Tanya Nones, Priscille Heidelberg, MD   5 years ago Routine general medical examination at a health care facility   [facility] Medicine Pickard, Priscille Heidelberg, MD   6 years ago Acute pharyngitis, unspecified etiology   [facility] [place], Velna Hatchet, MD       Future Appointments             In 4 weeks Tanya Nones, Priscille Heidelberg, MD [facility], [facility]            Failed - Lipid Panel in normal range within the last 12 months    Cholesterol  Date Value Ref Range Status  09/25/2021 157 <200 mg/dL Final   LDL Cholesterol (Calc)  Date Value Ref Range Status  09/25/2021 92 mg/dL (calc) Final    Comment:    Reference range: <100 . Desirable range <100 mg/dL for primary  prevention;   <70 mg/dL for patients with CHD or diabetic patients  with > or = 2 CHD risk factors. Marland Kitchen LDL-C is now calculated using the Martin-Hopkins  calculation, which is a validated novel method providing  better accuracy than the Friedewald equation in the  estimation of LDL-C.  Horald Pollen et al. Lenox Ahr. 1610;960(45): 2061-2068  (http://education.[employer].com/faq/FAQ164)    HDL  Date Value Ref Range Status  09/25/2021 50 > OR = 40 mg/dL Final   Triglycerides  Date Value Ref Range Status  09/25/2021 67 <150 mg/dL Final         Passed - Patient is not pregnant       omeprazole (PRILOSEC) 40 MG capsule [Pharmacy Med Name: OMEPRAZOLE DR 40 MG CAPSULE] 90 capsule 0    Sig: TAKE 1 CAPSULE BY MOUTH EVERY DAY     Gastroenterology: Proton Pump Inhibitors Failed - 08/30/2022  2:35 AM      Failed - Valid encounter within last 12 months    Recent Outpatient Visits           1 year ago Hypersomnolence   [facility] Pickard, Priscille Heidelberg, MD   3 years ago Colon cancer screening   [facility] Donita Brooks, MD   4 years ago Benign essential HTN   [facility] Tanya Nones, Priscille Heidelberg, MD   5 years ago Routine general medical examination at a health care facility   [facility] Medicine Pickard, Priscille Heidelberg, MD   6 years ago Acute pharyngitis, unspecified etiology   [facility] [place], Velna Hatchet, MD       Future Appointments             In 4 weeks Pickard, Priscille Heidelberg, MD [facility], [facility]

## 2022-09-17 ENCOUNTER — Other Ambulatory Visit: Payer: No Typology Code available for payment source

## 2022-09-17 LAB — CBC WITH DIFFERENTIAL/PLATELET
Neutrophils Relative %: 58.8 %
WBC: 5.4 10*3/uL (ref 3.8–10.8)

## 2022-09-17 LAB — COMPLETE METABOLIC PANEL WITH GFR
Alkaline phosphatase (APISO): 125 U/L (ref 35–144)
Globulin: 2.6 g/dL (calc) (ref 1.9–3.7)
eGFR: 73 mL/min/{1.73_m2} (ref >=60)

## 2022-09-18 ENCOUNTER — Other Ambulatory Visit: Payer: No Typology Code available for payment source

## 2022-09-18 LAB — CBC WITH DIFFERENTIAL/PLATELET
Hemoglobin: 13.8 g/dL (ref 13.2–17.1)
Lymphs Abs: 1690 cells/uL (ref 850–3900)

## 2022-09-18 LAB — HEMOGLOBIN A1C
Hgb A1c MFr Bld: 6.5 % of total Hgb — ABNORMAL HIGH (ref <5.7)
eAG (mmol/L): 7.7 mmol/L

## 2022-09-18 LAB — COMPLETE METABOLIC PANEL WITH GFR: BUN: 17 mg/dL (ref 7–25)

## 2022-09-19 LAB — LIPID PANEL
Cholesterol: 149 mg/dL (ref <200)
HDL: 49 mg/dL (ref >=40)
LDL Cholesterol (Calc): 83 mg/dL
Non-HDL Cholesterol (Calc): 100 mg/dL (calc) (ref <130)
Total CHOL/HDL Ratio: 3 (calc) (ref <5.0)
Triglycerides: 78 mg/dL (ref <150)

## 2022-09-19 LAB — CBC WITH DIFFERENTIAL/PLATELET
Absolute Monocytes: 356 {cells}/uL (ref 200–950)
Basophils Absolute: 49 cells/uL (ref 0–200)
Basophils Relative: 0.9 %
Eosinophils Absolute: 130 {cells}/uL (ref 15–500)
Eosinophils Relative: 2.4 %
HCT: 40.6 % (ref 38.5–50.0)
MCH: 30.9 pg (ref 27.0–33.0)
MCHC: 34 g/dL (ref 32.0–36.0)
MCV: 90.8 fL (ref 80.0–100.0)
MPV: 9.6 fL (ref 7.5–12.5)
Monocytes Relative: 6.6 %
Neutro Abs: 3175 {cells}/uL (ref 1500–7800)
Platelets: 294 10*3/uL (ref 140–400)
RBC: 4.47 10*6/uL (ref 4.20–5.80)
RDW: 12.7 % (ref 11.0–15.0)
Total Lymphocyte: 31.3 %

## 2022-09-19 LAB — COMPLETE METABOLIC PANEL WITHOUT GFR
AG Ratio: 1.6 (calc) (ref 1.0–2.5)
ALT: 32 U/L (ref 9–46)
AST: 16 U/L (ref 10–35)
Albumin: 4.1 g/dL (ref 3.6–5.1)
CO2: 32 mmol/L (ref 20–32)
Calcium: 9.1 mg/dL (ref 8.6–10.3)
Chloride: 102 mmol/L (ref 98–110)
Creat: 1.19 mg/dL (ref 0.70–1.30)
Glucose, Bld: 132 mg/dL — ABNORMAL HIGH (ref 65–99)
Potassium: 3.9 mmol/L (ref 3.5–5.3)
Sodium: 142 mmol/L (ref 135–146)
Total Bilirubin: 0.4 mg/dL (ref 0.2–1.2)
Total Protein: 6.7 g/dL (ref 6.1–8.1)

## 2022-09-19 LAB — PSA, TOTAL AND FREE
PSA, % Free: 27 % (calc) (ref >25)
PSA, Free: 0.3 ng/mL
PSA, Total: 1.1 ng/mL (ref <=4.0)

## 2022-09-19 LAB — COMPLETE METABOLIC PANEL WITH GFR

## 2022-09-19 LAB — HEMOGLOBIN A1C: Mean Plasma Glucose: 140 mg/dL

## 2022-09-25 ENCOUNTER — Other Ambulatory Visit: Payer: Self-pay | Admitting: Family Medicine

## 2022-09-25 NOTE — Telephone Encounter (Signed)
Requested Prescriptions  Pending Prescriptions Disp Refills   hydrochlorothiazide (HYDRODIURIL) 25 MG tablet [Pharmacy Med Name: HYDROCHLOROTHIAZIDE 25 MG TAB] 90 tablet 0    Sig: TAKE 1 TABLET (25 MG TOTAL) BY MOUTH DAILY.     Cardiovascular: Diuretics - Thiazide Failed - 09/25/2022  8:21 AM      Failed - Last BP in normal range    BP Readings from Last 1 Encounters:  09/08/21 (!) 150/76         Failed - Valid encounter within last 6 months    Recent Outpatient Visits           1 year ago Hypersomnolence   [facility] Tanya Nones, Priscille Heidelberg, MD   3 years ago Colon cancer screening   [facility] Donita Brooks, MD   4 years ago Benign essential HTN   [facility] Tanya Nones, Priscille Heidelberg, MD   5 years ago Routine general medical examination at a health care facility   [facility] Donita Brooks, MD   6 years ago Acute pharyngitis, unspecified etiology   [facility] Medicine [place], Velna Hatchet, MD       Future Appointments             In 3 days Pickard, Priscille Heidelberg, MD [facility], [facility]            Passed - Cr in normal range and within 180 days    Creat  Date Value Ref Range Status  09/17/2022 1.19 0.70 - 1.30 mg/dL Final         Passed - K in normal range and within 180 days    Potassium  Date Value Ref Range Status  09/17/2022 3.9 3.5 - 5.3 mmol/L Final         Passed - Na in normal range and within 180 days    Sodium  Date Value Ref Range Status  09/17/2022 142 135 - 146 mmol/L Final

## 2022-09-27 ENCOUNTER — Other Ambulatory Visit: Payer: Self-pay | Admitting: Family Medicine

## 2022-09-28 ENCOUNTER — Ambulatory Visit: Payer: No Typology Code available for payment source | Admitting: Family Medicine

## 2022-09-28 ENCOUNTER — Encounter: Payer: Self-pay | Admitting: Family Medicine

## 2022-09-28 VITALS — BP 132/76 | HR 66 | Temp 98.4°F | Ht 73.0 in | Wt 312.8 lb

## 2022-09-28 DIAGNOSIS — I10 Essential (primary) hypertension: Secondary | ICD-10-CM | POA: Diagnosis not present

## 2022-09-28 DIAGNOSIS — Z0001 Encounter for general adult medical examination with abnormal findings: Secondary | ICD-10-CM

## 2022-09-28 DIAGNOSIS — E785 Hyperlipidemia, unspecified: Secondary | ICD-10-CM | POA: Diagnosis not present

## 2022-09-28 DIAGNOSIS — E118 Type 2 diabetes mellitus with unspecified complications: Secondary | ICD-10-CM | POA: Diagnosis not present

## 2022-09-28 DIAGNOSIS — Z1211 Encounter for screening for malignant neoplasm of colon: Secondary | ICD-10-CM

## 2022-09-28 DIAGNOSIS — Z125 Encounter for screening for malignant neoplasm of prostate: Secondary | ICD-10-CM

## 2022-09-28 DIAGNOSIS — Z0000 Encounter for general adult medical examination without abnormal findings: Secondary | ICD-10-CM

## 2022-09-28 MED ORDER — OZEMPIC (0.25 OR 0.5 MG/DOSE) 2 MG/3ML ~~LOC~~ SOPN: 0.5000 mg | PEN_INJECTOR | SUBCUTANEOUS | 3 refills | Status: DC

## 2022-09-28 NOTE — Telephone Encounter (Signed)
Requested medication (s) are due for refill today:yes  Requested medication (s) are on the active medication list: yes  Last refill:  09/08/21 #5 tab 11 RF  Future visit scheduled: yes  Notes to clinic:  pt has appt today    Requested Prescriptions  Pending Prescriptions Disp Refills   tadalafil (CIALIS) 20 MG tablet [Pharmacy Med Name: Tadalafil 20 MG Oral Tablet] 5 tablet 0    Sig: TAKE 1/2 TO 1 (ONE-HALF TO ONE) TABLET BY MOUTH EVERY OTHER DAY AS NEEDED FOR ERECTILE DYSFUNCTION     Urology: Erectile Dysfunction Agents Failed - 09/27/2022 11:19 PM      Failed - Last BP in normal range    BP Readings from Last 1 Encounters:  09/08/21 (!) 150/76         Failed - Valid encounter within last 12 months    Recent Outpatient Visits           1 year ago Hypersomnolence   [facility] Tanya Nones, Priscille Heidelberg, MD   3 years ago Colon cancer screening   [facility] Donita Brooks, MD   4 years ago Benign essential HTN   [facility] Tanya Nones, Priscille Heidelberg, MD   5 years ago Routine general medical examination at a health care facility   [facility] Medicine [place], Priscille Heidelberg, MD   6 years ago Acute pharyngitis, unspecified etiology   [facility] [place], Velna Hatchet, MD       Future Appointments             Today Donita Brooks, MD  [facility], [facility]            Passed - AST in normal range and within 360 days    AST  Date Value Ref Range Status  09/17/2022 16 10 - 35 U/L Final         Passed - ALT in normal range and within 360 days    ALT  Date Value Ref Range Status  09/17/2022 32 9 - 46 U/L Final

## 2022-09-28 NOTE — Progress Notes (Signed)
Subjective:    Patient ID: Travis Sanders, male    DOB: Jun 10, 1970, 52 y.o.   MRN: [id number]  HPI Patient is a very pleasant 52 year old Caucasian gentleman who is here today for a CPE.  He has a history of hypertension, hyperlipidemia, and obstructive sleep apnea.  Last colonoscopy was 2021.  GI recommended repeat colonoscopy in 3 years.  Patient's most recent lab work is listed below Lab on 09/17/2022  Component Date Value Ref Range Status   Glucose, Bld 09/17/2022 132 (H)  65 - 99 mg/dL Final   Comment: .            Fasting reference interval . For someone without known diabetes, a glucose value >125 mg/dL indicates that they may have diabetes and this should be confirmed with a follow-up test. .    BUN 09/17/2022 17  7 - 25 mg/dL Final   Creat 40/98/1191 1.19  0.70 - 1.30 mg/dL Final   eGFR 47/82/9562 73  > OR = 60 mL/min/1.68m2 Final   BUN/Creatinine Ratio 09/17/2022 SEE NOTE:  6 - 22 (calc) Final   Comment:    Not Reported: BUN and Creatinine are within    reference range. .    Sodium 09/17/2022 142  135 - 146 mmol/L Final   Potassium 09/17/2022 3.9  3.5 - 5.3 mmol/L Final   Chloride 09/17/2022 102  98 - 110 mmol/L Final   CO2 09/17/2022 32  20 - 32 mmol/L Final   Calcium 09/17/2022 9.1  8.6 - 10.3 mg/dL Final   Total Protein 13/10/6576 6.7  6.1 - 8.1 g/dL Final   Albumin 46/96/2952 4.1  3.6 - 5.1 g/dL Final   Globulin 84/13/2440 2.6  1.9 - 3.7 g/dL (calc) Final   AG Ratio 09/17/2022 1.6  1.0 - 2.5 (calc) Final   Total Bilirubin 09/17/2022 0.4  0.2 - 1.2 mg/dL Final   Alkaline phosphatase (APISO) 09/17/2022 125  35 - 144 U/L Final   AST 09/17/2022 16  10 - 35 U/L Final   ALT 09/17/2022 32  9 - 46 U/L Final   WBC 09/17/2022 5.4  3.8 - 10.8 Thousand/uL Final   RBC 09/17/2022 4.47  4.20 - 5.80 Million/uL Final   Hemoglobin 09/17/2022 13.8  13.2 - 17.1 g/dL Final   HCT 01/13/2535 40.6  38.5 - 50.0 % Final   MCV 09/17/2022 90.8  80.0 - 100.0 fL Final   MCH  09/17/2022 30.9  27.0 - 33.0 pg Final   MCHC 09/17/2022 34.0  32.0 - 36.0 g/dL Final   RDW 64/40/3474 12.7  11.0 - 15.0 % Final   Platelets 09/17/2022 294  140 - 400 Thousand/uL Final   MPV 09/17/2022 9.6  7.5 - 12.5 fL Final   Neutro Abs 09/17/2022 3,175  1,500 - 7,800 cells/uL Final   Lymphs Abs 09/17/2022 1,690  850 - 3,900 cells/uL Final   Absolute Monocytes 09/17/2022 356  200 - 950 cells/uL Final   Eosinophils Absolute 09/17/2022 130  15 - 500 cells/uL Final   Basophils Absolute 09/17/2022 49  0 - 200 cells/uL Final   Neutrophils Relative % 09/17/2022 58.8  % Final   Total Lymphocyte 09/17/2022 31.3  % Final   Monocytes Relative 09/17/2022 6.6  % Final   Eosinophils Relative 09/17/2022 2.4  % Final   Basophils Relative 09/17/2022 0.9  % Final   PSA, Total 09/17/2022 1.1  < OR = 4.0 ng/mL Final   PSA, Free 09/17/2022 0.3  ng/mL Final   PSA, %  Free 09/17/2022 27  >25 % (calc) Final   Comment: . PSA(ng/mL)      Free PSA(%)     Estimated(x) Probability                                      of Cancer(as%) 0-2.5              (*)               Approx. 1 2.6-4.0(1)         0-27(2)                   24(3) 4.1-10(4)          0-10                      56                    11-15                     28                    16-20                     20                    21-25                     16                    >or =26                   8 >10(+)             N/A                      >50 . References:(1)Catalona et al.:Urology 60: 469-474 (2002)            (2)Catalona et al.:J.Urol 168: 922-925 (2002)               Free PSA(%)   Sensitivity(%)  Specificity(%)               < or = 25          85              19               < or = 30          93               9            (3)Catalona et al.:JAMA 277: 1452-1455 (1997)            (4)Catalona et al.:JAMA 279: 1610-9604 (1998) . (x)These estimates vary with age, ethnicity, family     history and DRE results. (*)The                            diagnostic usefulness of % Free PSA has not been    established in patients with total PSA below 2.6 ng/mL (+)In men with PSA above 10 ng/mL, prostate cancer risk is  determined by total PSA alone. . The Total PSA value from this assay system is  standardized against the equimolar PSA standard.  The test result will be approximately 20% higher  when compared to the [facility] Total PSA  (Siemens assay). Comparison of serial PSA results  should be interpreted with this fact in mind. Marland Kitchen PSA was performed using the Beckman Coulter Immunoassay method. Values obtained from different assay methods cannot be used interchangeably. PSA levels, regardless of value, should not be interpreted as absolute evidence of the presence or absence of disease. .    Cholesterol 09/17/2022 149  <200 mg/dL Final   HDL 16/12/9602 49  > OR = 40 mg/dL Final   Triglycerides 54/11/8117 78  <150 mg/dL Final   LDL Cholesterol (Calc) 09/17/2022 83  mg/dL (calc) Final   Comment: Reference range: <100 . Desirable range <100 mg/dL for primary prevention;   <70 mg/dL for patients with CHD or diabetic patients  with > or = 2 CHD risk factors. Marland Kitchen LDL-C is now calculated using the Martin-Hopkins  calculation, which is a validated novel method providing  better accuracy than the Friedewald equation in the  estimation of LDL-C.  Horald Pollen et al. Lenox Ahr. 1478;295(62): 2061-2068  (http://education.[facility].com/faq/FAQ164)    Total CHOL/HDL Ratio 09/17/2022 3.0  <1.3 (calc) Final   Non-HDL Cholesterol (Calc) 09/17/2022 100  <130 mg/dL (calc) Final   Comment: For patients with diabetes plus 1 major ASCVD risk  factor, treating to a non-HDL-C goal of <100 mg/dL  (LDL-C of <08 mg/dL) is considered a therapeutic  option.    Hgb A1c MFr Bld 09/17/2022 6.5 (H)  <5.7 % of total Hgb Final   Comment: For someone without known diabetes, a hemoglobin A1c value of 6.5% or greater indicates that they may have   diabetes and this should be confirmed with a follow-up  test. . For someone with known diabetes, a value <7% indicates  that their diabetes is well controlled and a value  greater than or equal to 7% indicates suboptimal  control. A1c targets should be individualized based on  duration of diabetes, age, comorbid conditions, and  other considerations. . Currently, no consensus exists regarding use of hemoglobin A1c for diagnosis of diabetes for children. .    Mean Plasma Glucose 09/17/2022 140  mg/dL Final   eAG (mmol/L) 65/78/4696 7.7  mmol/L Final   Comment: . This test was performed on the Roche cobas c503 platform. Effective 12/25/21, a change in test platforms from the Abbott Architect to the Roche cobas c503 may have shifted HbA1c results compared to historical results. Based on laboratory validation testing conducted at [facility], the Roche platform relative to the Abbott platform had an average increase in HbA1c value of < or = 0.3%. This difference is within accepted  variability established by the [facility]. Note that not all individuals will have had a shift in their results and direct comparisons between historical and current results for testing conducted on different platforms is not recommended.     Past Medical History:  Diagnosis Date   GERD (gastroesophageal reflux disease)    Hyperlipidemia    Hypertension    OSA (obstructive sleep apnea)    Past Surgical History:  Procedure Laterality Date   COLONOSCOPY N/A 11/05/2019   Procedure: COLONOSCOPY;  Surgeon: Malissa Hippo, MD;  [facility];  Service: Endoscopy;  Laterality: N/A;  1030   KNEE ARTHROSCOPY     POLYPECTOMY  11/05/2019   Procedure:  POLYPECTOMY;  Surgeon: Malissa Hippo, MD;  [facility];  Service: Endoscopy;;   TONSILLECTOMY     Current Outpatient Medications on File Prior to Visit  Medication Sig Dispense Refill   amLODipine  (NORVASC) 5 MG tablet TAKE 1 TABLET (5 MG TOTAL) BY MOUTH DAILY. 90 tablet 0   atorvastatin (LIPITOR) 20 MG tablet TAKE 1 TABLET BY MOUTH EVERY DAY 90 tablet 0   hydrochlorothiazide (HYDRODIURIL) 25 MG tablet TAKE 1 TABLET (25 MG TOTAL) BY MOUTH DAILY. 90 tablet 0   losartan (COZAAR) 100 MG tablet TAKE 1 TABLET BY MOUTH EVERY DAY 90 tablet 0   Multiple Vitamin (MULTIVITAMIN WITH MINERALS) TABS tablet Take 1 tablet by mouth daily.     omeprazole (PRILOSEC) 40 MG capsule TAKE 1 CAPSULE BY MOUTH EVERY DAY 90 capsule 0   tadalafil (CIALIS) 20 MG tablet TAKE 1/2 TO 1 (ONE-HALF TO ONE) TABLET BY MOUTH EVERY OTHER DAY AS NEEDED FOR ERECTILE DYSFUNCTION 5 tablet 11   XIIDRA 5 % SOLN Place 1 drop into both eyes 2 (two) times daily.     No current facility-administered medications on file prior to visit.   No Known Allergies Social History   Socioeconomic History   Marital status: Married    Spouse name: Not on file   Number of children: Not on file   Years of education: Not on file   Highest education level: Not on file  Occupational History   Not on file  Tobacco Use   Smoking status: Never   Smokeless tobacco: Never  Substance and Sexual Activity   Alcohol use: Yes    Alcohol/week: 3.0 standard drinks of alcohol    Types: 3 Cans of beer per week    Comment: occasionally   Drug use: No   Sexual activity: Yes  Other Topics Concern   Not on file  Social History Narrative   Not on file   Social Determinants of Health   Financial Resource Strain: Not on file  Food Insecurity: Not on file  Transportation Needs: Not on file  Physical Activity: Not on file  Stress: Not on file  Social Connections: Not on file  Intimate Partner Violence: Not on file   Family History  Problem Relation Age of Onset   Sleep apnea Neg Hx       Review of Systems  All other systems reviewed and are negative.      Objective:   Physical Exam Vitals reviewed.  Constitutional:      General: He is  not in acute distress.    Appearance: He is well-developed. He is not diaphoretic.  HENT:     Head: Normocephalic and atraumatic.     Right Ear: External ear normal.     Left Ear: External ear normal.     Nose: Nose normal.     Mouth/Throat:     Pharynx: No oropharyngeal exudate.  Eyes:     General: No scleral icterus.       Right eye: No discharge.        Left eye: No discharge.     Conjunctiva/sclera: Conjunctivae normal.     Pupils: Pupils are equal, round, and reactive to light.  Neck:     Thyroid: No thyromegaly.     Vascular: No JVD.     Trachea: No tracheal deviation.  Cardiovascular:     Rate and Rhythm: Normal rate and regular rhythm.     Heart sounds: Normal heart sounds. No murmur heard.  No friction rub. No gallop.  Pulmonary:     Effort: Pulmonary effort is normal. No respiratory distress.     Breath sounds: Normal breath sounds. No stridor. No wheezing or rales.  Chest:     Chest wall: No tenderness.  Abdominal:     General: Bowel sounds are normal. There is no distension.     Palpations: Abdomen is soft. There is no mass.     Tenderness: There is no abdominal tenderness. There is no guarding or rebound.  Musculoskeletal:        General: No tenderness. Normal range of motion.     Cervical back: Normal range of motion and neck supple.  Lymphadenopathy:     Cervical: No cervical adenopathy.  Skin:    General: Skin is warm.     Coloration: Skin is not pale.     Findings: No erythema or rash.  Neurological:     Mental Status: He is alert and oriented to person, place, and time.     Cranial Nerves: No cranial nerve deficit.     Motor: No abnormal muscle tone.     Coordination: Coordination normal.     Deep Tendon Reflexes: Reflexes are normal and symmetric.  Psychiatric:        Behavior: Behavior normal.        Thought Content: Thought content normal.        Judgment: Judgment normal.           Assessment & Plan:  General medical exam  Benign  essential HTN  Hyperlipidemia, unspecified hyperlipidemia type  Prostate cancer screening  Colon cancer screening - Plan: Ambulatory referral to Gastroenterology  Controlled type 2 diabetes mellitus with complication, without long-term current use of insulin (HCC) Weight is the patient's biggest issue.  Patient is now a diabetic.  Begin Ozempic 0.5 mg subcu weekly and uptitrate if tolerated on a monthly basis.  Recheck A1c and weight in 6 months.  Blood pressure today is acceptable.  Schedule patient for colonoscopy.  PSA is within normal limits.  Cholesterol is acceptable.  Recommended a trial of discontinuation of omeprazole.  Recheck in 6 months or sooner if symptoms develop

## 2022-10-02 ENCOUNTER — Encounter (INDEPENDENT_AMBULATORY_CARE_PROVIDER_SITE_OTHER): Payer: Self-pay | Admitting: *Deleted

## 2022-10-10 ENCOUNTER — Telehealth: Payer: Self-pay

## 2022-10-10 DIAGNOSIS — E119 Type 2 diabetes mellitus without complications: Secondary | ICD-10-CM | POA: Insufficient documentation

## 2022-10-10 NOTE — Telephone Encounter (Signed)
Pt's insurance is requiring a PA for pt's Ozempic. Form placed in green folder for you to sign. Thank you.

## 2022-10-19 NOTE — Telephone Encounter (Signed)
Patient called for status update of PA for Ozempic.   Requesting call back.  Please advise at [phone].

## 2022-11-06 ENCOUNTER — Other Ambulatory Visit: Payer: Self-pay | Admitting: Family Medicine

## 2022-11-07 NOTE — Telephone Encounter (Signed)
Last OV 09/28/22 Requested Prescriptions  Pending Prescriptions Disp Refills   tadalafil (CIALIS) 20 MG tablet [Pharmacy Med Name: Tadalafil 20 MG Oral Tablet] 5 tablet 0    Sig: TAKE 1/2 TO 1 (ONE-HALF TO ONE) TABLET BY MOUTH EVERY OTHER DAY AS NEEDED FOR ERECTILE DYSFUNCTION . APPOINTMENT REQUIRED FOR FUTURE REFILLS     Urology: Erectile Dysfunction Agents Failed - 11/06/2022  8:57 AM      Failed - Valid encounter within last 12 months    Recent Outpatient Visits           2 years ago Hypersomnolence   [facility] [place], Priscille Heidelberg, MD   3 years ago Colon cancer screening   [facility] Donita Brooks, MD   4 years ago Benign essential HTN   [facility] Donita Brooks, MD   5 years ago Routine general medical examination at a health care facility   [facility] [place], Priscille Heidelberg, MD   6 years ago Acute pharyngitis, unspecified etiology   [facility] [place], Velna Hatchet, MD              Passed - AST in normal range and within 360 days    AST  Date Value Ref Range Status  09/17/2022 16 10 - 35 U/L Final         Passed - ALT in normal range and within 360 days    ALT  Date Value Ref Range Status  09/17/2022 32 9 - 46 U/L Final         Passed - Last BP in normal range    BP Readings from Last 1 Encounters:  09/28/22 132/76

## 2022-11-20 NOTE — Telephone Encounter (Addendum)
  LOV: 09/28/22 **Please see previous messages in thread** Pharmacy requesting refill of  tadalafil (CIALIS) 20 MG tablet [Pharmacy Med Name: Tadalafil 20 MG Oral Tablet]   omeprazole (PRILOSEC) 40 MG capsule [[id number]]   Pharmacy:

## 2022-11-21 NOTE — Telephone Encounter (Signed)
Pharmacy requesting refills of the following meds:   tadalafil (CIALIS) 20 MG tablet [[id number]]   omeprazole (PRILOSEC) 40 MG capsule [[id number]]   amLODipine (NORVASC) 5 MG tablet [[id number]]    atorvastatin (LIPITOR) 20 MG tablet [[id number]]   losartan (COZAAR) 100 MG tablet [[id number]]   **All meds requested listed here**  Pharmacy:  [id number] - [place], [place] - [address] AT [facility] [address], [place] [address] Phone: [phone]  Fax: [phone] DEA #: [id number]   Please advise pharmacist.

## 2022-11-21 NOTE — Telephone Encounter (Signed)
Pharmacy also requesting refill of   amLODipine (NORVASC) 5 MG tablet [[id number]]   atorvastatin (LIPITOR) 20 MG tablet [[id number]]   Please see previous messages in thread for other refills requested.

## 2022-11-22 ENCOUNTER — Other Ambulatory Visit: Payer: Self-pay | Admitting: Family Medicine

## 2022-11-22 ENCOUNTER — Other Ambulatory Visit: Payer: Self-pay

## 2022-11-22 DIAGNOSIS — E785 Hyperlipidemia, unspecified: Secondary | ICD-10-CM

## 2022-11-22 MED ORDER — ATORVASTATIN CALCIUM 20 MG PO TABS
20.0000 mg | ORAL_TABLET | Freq: Every day | ORAL | 1 refills | Status: DC
Start: 2022-11-22 — End: 2023-05-22

## 2022-11-22 NOTE — Telephone Encounter (Signed)
Pharmacy requesting refills of   losartan (COZAAR) 100 MG tablet   atorvastatin (LIPITOR) 20 MG tablet [[id number]]   [facility] [address] [place]  See previous messages in thread.

## 2022-11-22 NOTE — Telephone Encounter (Signed)
Prescription Request  11/22/2022  LOV: 09/28/2022  What is the name of the medication or equipment? Atorvastatin 20 mg  Have you contacted your pharmacy to request a refill? Yes   Which pharmacy would you like this sent to?  [id number] - [place], [place] - [address] AT [facility] [address] [place] [address] Phone: [phone] Fax: [phone]    Patient notified that their request is being sent to the clinical staff for review and that they should receive a response within 2 business days.   Please advise at [facility] [phone]

## 2022-11-22 NOTE — Telephone Encounter (Signed)
Prescription Request  11/22/2022  LOV: 09/28/2022  What is the name of the medication or equipment? omeprazole (PRILOSEC) 40 MG capsule   Have you contacted your pharmacy to request a refill? Yes   Which pharmacy would you like this sent to?  [id number] - [place], [place] - [address] AT [facility] [address] [place] [address] Phone: [phone] Fax: [phone]    Patient notified that their request is being sent to the clinical staff for review and that they should receive a response within 2 business days.   Please advise at [facility] [phone]

## 2022-11-22 NOTE — Telephone Encounter (Signed)
Amlodipine sent today in a separate refill encounter.

## 2022-11-22 NOTE — Telephone Encounter (Signed)
Prescription Request  11/22/2022  LOV: 09/28/2022  What is the name of the medication or equipment? Amlodipine besylate 5 mg tab  Have you contacted your pharmacy to request a refill? Yes   Which pharmacy would you like this sent to?  [id number] - [place], [place] - [address] AT [facility] [address] [place] [address] Phone: [phone] Fax: [phone]    Patient notified that their request is being sent to the clinical staff for review and that they should receive a response within 2 business days.   Please advise at [facility] [phone]

## 2022-11-23 MED ORDER — AMLODIPINE BESYLATE 5 MG PO TABS: 5.0000 mg | ORAL_TABLET | Freq: Every day | ORAL | 1 refills | Status: DC

## 2022-11-23 MED ORDER — LOSARTAN POTASSIUM 100 MG PO TABS: 100.0000 mg | ORAL_TABLET | Freq: Every day | ORAL | 1 refills | Status: DC

## 2022-11-23 MED ORDER — OMEPRAZOLE 40 MG PO CPDR: DELAYED_RELEASE_CAPSULE | ORAL | 1 refills | Status: DC

## 2022-11-23 NOTE — Telephone Encounter (Signed)
Requested Prescriptions  Pending Prescriptions Disp Refills   amLODipine (NORVASC) 5 MG tablet 90 tablet 1    Sig: Take 1 tablet (5 mg total) by mouth daily.     Cardiovascular: Calcium Channel Blockers 2 Failed - 11/22/2022  4:52 PM      Failed - Valid encounter within last 6 months    Recent Outpatient Visits           2 years ago Hypersomnolence   [facility] [place], Priscille Heidelberg, MD   3 years ago Colon cancer screening   [facility] Donita Brooks, MD   4 years ago Benign essential HTN   [facility] Tanya Nones, Priscille Heidelberg, MD   5 years ago Routine general medical examination at a health care facility   [facility] [place], Priscille Heidelberg, MD   6 years ago Acute pharyngitis, unspecified etiology   [facility] [place], Velna Hatchet, MD              Passed - Last BP in normal range    BP Readings from Last 1 Encounters:  09/28/22 132/76         Passed - Last Heart Rate in normal range    Pulse Readings from Last 1 Encounters:  09/28/22 66          losartan (COZAAR) 100 MG tablet 90 tablet 1    Sig: Take 1 tablet (100 mg total) by mouth daily.     Cardiovascular:  Angiotensin Receptor Blockers Failed - 11/22/2022  4:52 PM      Failed - Valid encounter within last 6 months    Recent Outpatient Visits           2 years ago Hypersomnolence   [facility] [place], Priscille Heidelberg, MD   3 years ago Colon cancer screening   [facility] Medicine Tanya Nones, Priscille Heidelberg, MD   4 years ago Benign essential HTN   [facility] Donita Brooks, MD   5 years ago Routine general medical examination at a health care facility   [facility] Medicine [place], Priscille Heidelberg, MD   6 years ago Acute pharyngitis, unspecified etiology   [facility] [place], Velna Hatchet, MD              Passed - Cr in normal range and within 180 days    Creat  Date Value Ref Range  Status  09/17/2022 1.19 0.70 - 1.30 mg/dL Final         Passed - K in normal range and within 180 days    Potassium  Date Value Ref Range Status  09/17/2022 3.9 3.5 - 5.3 mmol/L Final         Passed - Patient is not pregnant      Passed - Last BP in normal range    BP Readings from Last 1 Encounters:  09/28/22 132/76          omeprazole (PRILOSEC) 40 MG capsule 90 capsule 1    Sig: TAKE 1 CAPSULE BY MOUTH EVERY DAY     Gastroenterology: Proton Pump Inhibitors Failed - 11/22/2022  4:52 PM      Failed - Valid encounter within last 12 months    Recent Outpatient Visits           2 years ago Hypersomnolence   [facility] Medicine [place], Priscille Heidelberg, MD   3 years ago  Colon cancer screening   [facility] Donita Brooks, MD   4 years ago Benign essential HTN   [facility] Tanya Nones, Priscille Heidelberg, MD   5 years ago Routine general medical examination at a health care facility   [facility] Medicine [place], Priscille Heidelberg, MD   6 years ago Acute pharyngitis, unspecified etiology   [facility] Medicine [place], Velna Hatchet, MD

## 2022-12-21 ENCOUNTER — Other Ambulatory Visit: Payer: Self-pay | Admitting: Family Medicine

## 2022-12-21 NOTE — Telephone Encounter (Signed)
Due to a system glitch the last office visit for this practice is not detected correctly.   LOV 09/28/2022.   Labs in date  Requested Prescriptions  Pending Prescriptions Disp Refills   hydrochlorothiazide (HYDRODIURIL) 25 MG tablet [Pharmacy Med Name: HYDROCHLOROTHIAZIDE 25 MG TAB] 90 tablet 0    Sig: TAKE 1 TABLET (25 MG TOTAL) BY MOUTH DAILY.     Cardiovascular: Diuretics - Thiazide Failed - 12/21/2022  1:31 AM      Failed - Valid encounter within last 6 months    Recent Outpatient Visits           2 years ago Hypersomnolence   [facility] Medicine [place], Priscille Heidelberg, MD   3 years ago Colon cancer screening   [facility] Donita Brooks, MD   4 years ago Benign essential HTN   [facility] Medicine Tanya Nones, Priscille Heidelberg, MD   5 years ago Routine general medical examination at a health care facility   [facility] Medicine [place], Priscille Heidelberg, MD   6 years ago Acute pharyngitis, unspecified etiology   [facility] Medicine [place], Velna Hatchet, MD              Passed - Cr in normal range and within 180 days    Creat  Date Value Ref Range Status  09/17/2022 1.19 0.70 - 1.30 mg/dL Final         Passed - K in normal range and within 180 days    Potassium  Date Value Ref Range Status  09/17/2022 3.9 3.5 - 5.3 mmol/L Final         Passed - Na in normal range and within 180 days    Sodium  Date Value Ref Range Status  09/17/2022 142 135 - 146 mmol/L Final         Passed - Last BP in normal range    BP Readings from Last 1 Encounters:  09/28/22 132/76

## 2023-02-04 ENCOUNTER — Encounter: Payer: Self-pay | Admitting: Family Medicine

## 2023-02-22 ENCOUNTER — Other Ambulatory Visit: Payer: Self-pay | Admitting: Family Medicine

## 2023-02-22 NOTE — Telephone Encounter (Signed)
Requested medications are due for refill today.  unsure  Requested medications are on the active medications list.  yes  Last refill.   Future visit scheduled.   no  Notes to clinic.  Per note in chart of 11/18 patient is now on 1mg . Please review    Requested Prescriptions  Pending Prescriptions Disp Refills   OZEMPIC, 0.25 OR 0.5 MG/DOSE, 2 MG/3ML SOPN [Pharmacy Med Name: OZEMPIC 0.25-0.5 MG/DOSE PEN]  3    Sig: INJECT 0.5 MG INTO THE SKIN ONE TIME PER WEEK     Endocrinology:  Diabetes - GLP-1 Receptor Agonists - semaglutide Failed - 02/22/2023  2:47 AM      Failed - HBA1C in normal range and within 180 days    Hgb A1c MFr Bld  Date Value Ref Range Status  09/17/2022 6.5 (H) <5.7 % of total Hgb Final    Comment:    For someone without known diabetes, a hemoglobin A1c value of 6.5% or greater indicates that they may have  diabetes and this should be confirmed with a follow-up  test. . For someone with known diabetes, a value <7% indicates  that their diabetes is well controlled and a value  greater than or equal to 7% indicates suboptimal  control. A1c targets should be individualized based on  duration of diabetes, age, comorbid conditions, and  other considerations. . Currently, no consensus exists regarding use of hemoglobin A1c for diagnosis of diabetes for children. .          Failed - Valid encounter within last 6 months    Recent Outpatient Visits           2 years ago Hypersomnolence   [facility] Pickard, Priscille Heidelberg, MD   3 years ago Colon cancer screening   [facility] Tanya Nones Priscille Heidelberg, MD   4 years ago Benign essential HTN   [facility] Tanya Nones, Priscille Heidelberg, MD   5 years ago Routine general medical examination at a health care facility   [facility] Pickard, Priscille Heidelberg, MD   6 years ago Acute pharyngitis, unspecified etiology   [facility] [place], Velna Hatchet, MD               Passed - Cr in normal range and within 360 days    Creat  Date Value Ref Range Status  09/17/2022 1.19 0.70 - 1.30 mg/dL Final

## 2023-02-26 ENCOUNTER — Other Ambulatory Visit: Payer: Self-pay

## 2023-02-26 DIAGNOSIS — E785 Hyperlipidemia, unspecified: Secondary | ICD-10-CM

## 2023-02-26 DIAGNOSIS — E66813 Obesity, class 3: Secondary | ICD-10-CM

## 2023-02-26 DIAGNOSIS — E118 Type 2 diabetes mellitus with unspecified complications: Secondary | ICD-10-CM

## 2023-02-26 MED ORDER — SEMAGLUTIDE (1 MG/DOSE) 4 MG/3ML ~~LOC~~ SOPN
1.0000 mg | PEN_INJECTOR | SUBCUTANEOUS | 1 refills | Status: DC
Start: 2023-02-26 — End: 2023-05-09

## 2023-03-27 ENCOUNTER — Other Ambulatory Visit: Payer: Self-pay

## 2023-03-27 NOTE — Telephone Encounter (Signed)
 Prescription Request  03/27/2023  LOV: 09/28/22 CPE  What is the name of the medication or equipment? hydrochlorothiazide  (HYDRODIURIL ) 25 MG tablet [[id number]]   Have you contacted your pharmacy to request a refill? Yes   Which pharmacy would you like this sent to?  [id number] - [place], [place] - [address] AT [facility] [address] [place] [address] Phone: [phone] Fax: [phone]    Patient notified that their request is being sent to the clinical staff for review and that they should receive a response within 2 business days.   Please advise at [facility] [phone]

## 2023-03-29 ENCOUNTER — Encounter: Payer: Self-pay | Admitting: Family Medicine

## 2023-03-29 ENCOUNTER — Other Ambulatory Visit: Payer: Self-pay

## 2023-03-29 DIAGNOSIS — I10 Essential (primary) hypertension: Secondary | ICD-10-CM

## 2023-03-29 MED ORDER — HYDROCHLOROTHIAZIDE 25 MG PO TABS: 25.0000 mg | ORAL_TABLET | Freq: Every day | ORAL | 1 refills | Status: DC

## 2023-04-01 NOTE — Telephone Encounter (Signed)
 Requested Prescriptions  Refused Prescriptions Disp Refills   hydrochlorothiazide  (HYDRODIURIL ) 25 MG tablet 90 tablet 0    Sig: Take 1 tablet (25 mg total) by mouth daily.     Cardiovascular: Diuretics - Thiazide Failed - 04/01/2023  8:14 AM      Failed - Cr in normal range and within 180 days    Creat  Date Value Ref Range Status  09/17/2022 1.19 0.70 - 1.30 mg/dL Final         Failed - K in normal range and within 180 days    Potassium  Date Value Ref Range Status  09/17/2022 3.9 3.5 - 5.3 mmol/L Final         Failed - Na in normal range and within 180 days    Sodium  Date Value Ref Range Status  09/17/2022 142 135 - 146 mmol/L Final         Failed - Valid encounter within last 6 months    Recent Outpatient Visits           2 years ago Hypersomnolence   [facility] Duanne Butler DASEN, MD   3 years ago Colon cancer screening   [facility] Duanne Butler DASEN, MD   4 years ago Benign essential HTN   [facility] Duanne, Butler DASEN, MD   6 years ago Routine general medical examination at a health care facility   [facility] Medicine Pickard, Butler DASEN, MD   6 years ago Acute pharyngitis, unspecified etiology   [facility] Medicine [place], Theodoro FALCON, MD              Passed - Last BP in normal range    BP Readings from Last 1 Encounters:  09/28/22 132/76

## 2023-04-04 ENCOUNTER — Encounter (INDEPENDENT_AMBULATORY_CARE_PROVIDER_SITE_OTHER): Payer: Self-pay | Admitting: *Deleted

## 2023-04-20 ENCOUNTER — Other Ambulatory Visit: Payer: Self-pay | Admitting: Family Medicine

## 2023-04-20 DIAGNOSIS — E66813 Obesity, class 3: Secondary | ICD-10-CM

## 2023-04-20 DIAGNOSIS — E785 Hyperlipidemia, unspecified: Secondary | ICD-10-CM

## 2023-04-20 DIAGNOSIS — E118 Type 2 diabetes mellitus with unspecified complications: Secondary | ICD-10-CM

## 2023-04-22 NOTE — Telephone Encounter (Signed)
Requested medication (s) are due for refill today: yes   Requested medication (s) are on the active medication list: yes  Last refill:  02/26/23   #3 ml 1 refills  Future visit scheduled: no   Notes to clinic:  do you want to refill same dose or increase dose?     Requested Prescriptions  Pending Prescriptions Disp Refills   OZEMPIC, 1 MG/DOSE, 4 MG/3ML SOPN [Pharmacy Med Name: OZEMPIC 4 MG/3 ML (1 MG/DOSE)]  1    Sig: INJECT 1 MG ONCE A WEEK AS DIRECTED     Endocrinology:  Diabetes - GLP-1 Receptor Agonists - semaglutide Failed - 04/22/2023  2:06 PM      Failed - HBA1C in normal range and within 180 days    Hgb A1c MFr Bld  Date Value Ref Range Status  09/17/2022 6.5 (H) <5.7 % of total Hgb Final    Comment:    For someone without known diabetes, a hemoglobin A1c value of 6.5% or greater indicates that they may have  diabetes and this should be confirmed with a follow-up  test. . For someone with known diabetes, a value <7% indicates  that their diabetes is well controlled and a value  greater than or equal to 7% indicates suboptimal  control. A1c targets should be individualized based on  duration of diabetes, age, comorbid conditions, and  other considerations. . Currently, no consensus exists regarding use of hemoglobin A1c for diagnosis of diabetes for children. .          Failed - Valid encounter within last 6 months    Recent Outpatient Visits           2 years ago Hypersomnolence   [facility] Pickard, Priscille Heidelberg, MD   3 years ago Colon cancer screening   [facility] Tanya Nones Priscille Heidelberg, MD   4 years ago Benign essential HTN   [facility] Tanya Nones, Priscille Heidelberg, MD   6 years ago Routine general medical examination at a health care facility   [facility] Pickard, Priscille Heidelberg, MD   6 years ago Acute pharyngitis, unspecified etiology   [facility] [place], Velna Hatchet, MD               Passed - Cr in normal range and within 360 days    Creat  Date Value Ref Range Status  09/17/2022 1.19 0.70 - 1.30 mg/dL Final

## 2023-04-29 ENCOUNTER — Encounter: Payer: Self-pay | Admitting: Family Medicine

## 2023-05-13 ENCOUNTER — Encounter: Payer: Self-pay | Admitting: Family Medicine

## 2023-05-13 ENCOUNTER — Ambulatory Visit (INDEPENDENT_AMBULATORY_CARE_PROVIDER_SITE_OTHER): Payer: No Typology Code available for payment source | Admitting: Family Medicine

## 2023-05-13 VITALS — BP 120/72 | HR 83 | Temp 98.2°F | Ht 73.0 in | Wt 306.0 lb

## 2023-05-13 DIAGNOSIS — Z7985 Long-term (current) use of injectable non-insulin antidiabetic drugs: Secondary | ICD-10-CM

## 2023-05-13 DIAGNOSIS — E118 Type 2 diabetes mellitus with unspecified complications: Secondary | ICD-10-CM | POA: Diagnosis not present

## 2023-05-13 MED ORDER — SEMAGLUTIDE (2 MG/DOSE) 8 MG/3ML ~~LOC~~ SOPN: 2.0000 mg | PEN_INJECTOR | SUBCUTANEOUS | 5 refills | Status: DC

## 2023-05-13 NOTE — Progress Notes (Signed)
 Subjective:    Patient ID: Travis Sanders, male    DOB: 03-Dec-1970, 53 y.o.   MRN: [id number]  HPI Patient is here today for follow-up of his diabetes.  He is currently on Ozempic 1 mg subcu weekly.  He states the medication does not seem to affect his appetite at all.  He states that he has a decreased appetite for only about 1 day and then he feels back to normal.  He denies any nausea or vomiting or abdominal pain.  His blood pressure today is excellent at 120/72.  He checks his blood pressure at home and states that is between 120 and 140 systolic.  He denies any chest pain, shortness of breath, or dyspnea on exertion.  Diabetic foot exam was performed today and is normal.  Past Medical History:  Diagnosis Date   GERD (gastroesophageal reflux disease)    Hyperlipidemia    Hypertension    OSA (obstructive sleep apnea)    Past Surgical History:  Procedure Laterality Date   COLONOSCOPY N/A 11/05/2019   Procedure: COLONOSCOPY;  Surgeon: Malissa Hippo, MD;  Location: [facility];  Service: Endoscopy;  Laterality: N/A;  1030   KNEE ARTHROSCOPY     POLYPECTOMY  11/05/2019   Procedure: POLYPECTOMY;  Surgeon: Malissa Hippo, MD;  Location: [facility];  Service: Endoscopy;;   TONSILLECTOMY     Current Outpatient Medications on File Prior to Visit  Medication Sig Dispense Refill   amLODipine (NORVASC) 5 MG tablet Take 1 tablet (5 mg total) by mouth daily. 90 tablet 1   atorvastatin (LIPITOR) 20 MG tablet Take 1 tablet (20 mg total) by mouth daily. 90 tablet 1   hydrochlorothiazide (HYDRODIURIL) 25 MG tablet Take 1 tablet (25 mg total) by mouth daily. 90 tablet 1   losartan (COZAAR) 100 MG tablet Take 1 tablet (100 mg total) by mouth daily. 90 tablet 1   Multiple Vitamin (MULTIVITAMIN WITH MINERALS) TABS tablet Take 1 tablet by mouth daily.     omeprazole (PRILOSEC) 40 MG capsule TAKE 1 CAPSULE BY MOUTH EVERY DAY 90 capsule 1   Semaglutide, 1 MG/DOSE, (OZEMPIC, 1 MG/DOSE,) 4  MG/3ML SOPN INJECT 1 MG ONCE A WEEK AS DIRECTED 3 mL 1   tadalafil (CIALIS) 20 MG tablet TAKE 1/2 TO 1 (ONE-HALF TO ONE) TABLET BY MOUTH EVERY OTHER DAY AS NEEDED FOR ERECTILE DYSFUNCTION . APPOINTMENT REQUIRED FOR FUTURE REFILLS 15 tablet 1   XIIDRA 5 % SOLN Place 1 drop into both eyes 2 (two) times daily.     No current facility-administered medications on file prior to visit.   No Known Allergies Social History   Socioeconomic History   Marital status: Married    Spouse name: Not on file   Number of children: Not on file   Years of education: Not on file   Highest education level: Not on file  Occupational History   Not on file  Tobacco Use   Smoking status: Never   Smokeless tobacco: Never  Substance and Sexual Activity   Alcohol use: Yes    Alcohol/week: 3.0 standard drinks of alcohol    Types: 3 Cans of beer per week    Comment: occasionally   Drug use: No   Sexual activity: Yes  Other Topics Concern   Not on file  Social History Narrative   Not on file   Social Drivers of Health   Financial Resource Strain: Not on file  Food Insecurity: Not on file  Transportation Needs: Not on file  Physical Activity: Not on file  Stress: Not on file  Social Connections: Not on file  Intimate Partner Violence: Not on file   Family History  Problem Relation Age of Onset   Sleep apnea Neg Hx       Review of Systems  All other systems reviewed and are negative.      Objective:   Physical Exam Vitals reviewed.  Constitutional:      General: He is not in acute distress.    Appearance: He is well-developed. He is not diaphoretic.  HENT:     Head: Normocephalic and atraumatic.     Right Ear: External ear normal.     Left Ear: External ear normal.     Nose: Nose normal.     Mouth/Throat:     Pharynx: No oropharyngeal exudate.  Eyes:     General: No scleral icterus.       Right eye: No discharge.        Left eye: No discharge.     Conjunctiva/sclera: Conjunctivae  normal.     Pupils: Pupils are equal, round, and reactive to light.  Neck:     Thyroid: No thyromegaly.     Vascular: No JVD.     Trachea: No tracheal deviation.  Cardiovascular:     Rate and Rhythm: Normal rate and regular rhythm.     Heart sounds: Normal heart sounds. No murmur heard.    No friction rub. No gallop.  Pulmonary:     Effort: Pulmonary effort is normal. No respiratory distress.     Breath sounds: Normal breath sounds. No stridor. No wheezing or rales.  Chest:     Chest wall: No tenderness.  Abdominal:     General: Bowel sounds are normal. There is no distension.     Palpations: Abdomen is soft. There is no mass.     Tenderness: There is no abdominal tenderness. There is no guarding or rebound.  Musculoskeletal:        General: No tenderness. Normal range of motion.     Cervical back: Normal range of motion and neck supple.  Lymphadenopathy:     Cervical: No cervical adenopathy.  Skin:    General: Skin is warm.     Coloration: Skin is not pale.     Findings: No erythema or rash.  Neurological:     Mental Status: He is alert and oriented to person, place, and time.     Cranial Nerves: No cranial nerve deficit.     Motor: No abnormal muscle tone.     Coordination: Coordination normal.     Deep Tendon Reflexes: Reflexes are normal and symmetric.  Psychiatric:        Behavior: Behavior normal.        Thought Content: Thought content normal.        Judgment: Judgment normal.           Assessment & Plan:  Controlled type 2 diabetes mellitus with complication, without long-term current use of insulin (HCC) - Plan: CBC with Differential/Platelet, COMPLETE METABOLIC PANEL WITH GFR, Lipid panel, Hemoglobin A1c, Microalbumin/Creatinine Ratio, Urine Blood pressure is excellent.  I would like to check an A1c today.  Goal A1c is less than 6.5.  Together we decided to increase Ozempic to 2 mg subcu weekly to try to facilitate additional weight loss.  If this does not  work, we may consider switching to [employer].  Check a urine protein creatinine ratio to  evaluate for any diabetic nephropathy.  If elevated consider [place].  Also check a fasting lipid panel.  Goal LDL cholesterol is less than 100.

## 2023-05-14 LAB — CBC WITH DIFFERENTIAL/PLATELET
Absolute Lymphocytes: 2100 {cells}/uL (ref 850–3900)
Absolute Monocytes: 420 {cells}/uL (ref 200–950)
Basophils Absolute: 42 {cells}/uL (ref 0–200)
Basophils Relative: 0.6 %
Eosinophils Absolute: 119 {cells}/uL (ref 15–500)
Eosinophils Relative: 1.7 %
HCT: 44.9 % (ref 38.5–50.0)
Hemoglobin: 14.9 g/dL (ref 13.2–17.1)
MCH: 30.4 pg (ref 27.0–33.0)
MCHC: 33.2 g/dL (ref 32.0–36.0)
MCV: 91.6 fL (ref 80.0–100.0)
MPV: 9.6 fL (ref 7.5–12.5)
Monocytes Relative: 6 %
Neutro Abs: 4319 {cells}/uL (ref 1500–7800)
Neutrophils Relative %: 61.7 %
Platelets: 341 10*3/uL (ref 140–400)
RBC: 4.9 10*6/uL (ref 4.20–5.80)
RDW: 12.9 % (ref 11.0–15.0)
Total Lymphocyte: 30 %
WBC: 7 10*3/uL (ref 3.8–10.8)

## 2023-05-14 LAB — LIPID PANEL
Cholesterol: 156 mg/dL (ref <200)
HDL: 55 mg/dL (ref >=40)
LDL Cholesterol (Calc): 87 mg/dL
Non-HDL Cholesterol (Calc): 101 mg/dL (ref <130)
Total CHOL/HDL Ratio: 2.8 (calc) (ref <5.0)
Triglycerides: 62 mg/dL (ref <150)

## 2023-05-14 LAB — COMPLETE METABOLIC PANEL WITH GFR
AG Ratio: 1.6 (calc) (ref 1.0–2.5)
ALT: 40 U/L (ref 9–46)
AST: 18 U/L (ref 10–35)
Albumin: 4.3 g/dL (ref 3.6–5.1)
Alkaline phosphatase (APISO): 140 U/L (ref 35–144)
BUN: 22 mg/dL (ref 7–25)
CO2: 30 mmol/L (ref 20–32)
Calcium: 9.8 mg/dL (ref 8.6–10.3)
Chloride: 102 mmol/L (ref 98–110)
Creat: 1.2 mg/dL (ref 0.70–1.30)
Globulin: 2.7 g/dL (ref 1.9–3.7)
Glucose, Bld: 105 mg/dL — ABNORMAL HIGH (ref 65–99)
Potassium: 4 mmol/L (ref 3.5–5.3)
Sodium: 142 mmol/L (ref 135–146)
Total Bilirubin: 0.4 mg/dL (ref 0.2–1.2)
Total Protein: 7 g/dL (ref 6.1–8.1)
eGFR: 72 mL/min/{1.73_m2} (ref >=60)

## 2023-05-14 LAB — HEMOGLOBIN A1C
Hgb A1c MFr Bld: 6 %{Hb} — ABNORMAL HIGH (ref <5.7)
Mean Plasma Glucose: 126 mg/dL
eAG (mmol/L): 7 mmol/L

## 2023-05-14 LAB — MICROALBUMIN / CREATININE URINE RATIO
Creatinine, Urine: 491 mg/dL — ABNORMAL HIGH (ref 20–320)
Microalb Creat Ratio: 5 mg/g{creat} (ref <30)
Microalb, Ur: 2.3 mg/dL

## 2023-05-22 ENCOUNTER — Other Ambulatory Visit: Payer: Self-pay

## 2023-05-22 ENCOUNTER — Telehealth: Payer: Self-pay | Admitting: Family Medicine

## 2023-05-22 DIAGNOSIS — I10 Essential (primary) hypertension: Secondary | ICD-10-CM

## 2023-05-22 DIAGNOSIS — E66813 Obesity, class 3: Secondary | ICD-10-CM

## 2023-05-22 DIAGNOSIS — K219 Gastro-esophageal reflux disease without esophagitis: Secondary | ICD-10-CM

## 2023-05-22 DIAGNOSIS — E118 Type 2 diabetes mellitus with unspecified complications: Secondary | ICD-10-CM

## 2023-05-22 DIAGNOSIS — E785 Hyperlipidemia, unspecified: Secondary | ICD-10-CM

## 2023-05-22 MED ORDER — AMLODIPINE BESYLATE 5 MG PO TABS: 5.0000 mg | ORAL_TABLET | Freq: Every day | ORAL | 1 refills | Status: DC

## 2023-05-22 MED ORDER — ATORVASTATIN CALCIUM 20 MG PO TABS
20.0000 mg | ORAL_TABLET | Freq: Every day | ORAL | 1 refills | Status: DC
Start: 2023-05-22 — End: 2023-11-13

## 2023-05-22 MED ORDER — OMEPRAZOLE 40 MG PO CPDR: DELAYED_RELEASE_CAPSULE | ORAL | 1 refills | Status: DC

## 2023-05-22 MED ORDER — LOSARTAN POTASSIUM 100 MG PO TABS: 100.0000 mg | ORAL_TABLET | Freq: Every day | ORAL | 1 refills | Status: DC

## 2023-05-22 NOTE — Telephone Encounter (Signed)
 Prescription Request  05/22/2023  LOV: 05/13/2023  What is the name of the medication or equipment?   losartan (COZAAR) 100 MG tablet   atorvastatin (LIPITOR) 20 MG tablet [[id number]]   amLODipine (NORVASC) 5 MG tablet [[id number]]   omeprazole (PRILOSEC) 40 MG capsule [[id number]]   Have you contacted your pharmacy to request a refill? Yes   Which pharmacy would you like this sent to?  [id number] - [place], [place] - [address] AT [facility] [address] [place] [address] Phone: [phone] Fax: [phone]    Patient notified that their request is being sent to the clinical staff for review and that they should receive a response within 2 business days.   Please advise pharmacist.

## 2023-08-21 ENCOUNTER — Other Ambulatory Visit: Payer: Self-pay | Admitting: Family Medicine

## 2023-09-18 ENCOUNTER — Other Ambulatory Visit: Payer: Self-pay | Admitting: Family Medicine

## 2023-09-18 DIAGNOSIS — I10 Essential (primary) hypertension: Secondary | ICD-10-CM

## 2023-09-24 ENCOUNTER — Telehealth: Payer: Self-pay

## 2023-09-24 NOTE — Telephone Encounter (Signed)
 Copied from CRM [id number]. Topic: Clinical - Request for Lab/Test Order >> Sep 24, 2023 10:07 AM Donna BRAVO wrote: Reason for CRM: patient calling requesting labs, patient has appt for physical on 09/30/23 patient would like labs done before appt  Please call patient when labs have been ordered.

## 2023-09-25 ENCOUNTER — Other Ambulatory Visit

## 2023-09-26 ENCOUNTER — Other Ambulatory Visit

## 2023-09-26 DIAGNOSIS — E785 Hyperlipidemia, unspecified: Secondary | ICD-10-CM

## 2023-09-26 DIAGNOSIS — E66813 Obesity, class 3: Secondary | ICD-10-CM

## 2023-09-26 DIAGNOSIS — I10 Essential (primary) hypertension: Secondary | ICD-10-CM

## 2023-09-26 DIAGNOSIS — E118 Type 2 diabetes mellitus with unspecified complications: Secondary | ICD-10-CM

## 2023-09-27 ENCOUNTER — Ambulatory Visit: Payer: Self-pay | Admitting: Family Medicine

## 2023-09-30 ENCOUNTER — Encounter: Payer: Self-pay | Admitting: Family Medicine

## 2023-09-30 ENCOUNTER — Ambulatory Visit: Payer: No Typology Code available for payment source | Admitting: Family Medicine

## 2023-09-30 VITALS — BP 136/82 | HR 68 | Temp 98.1°F | Ht 73.0 in | Wt 287.6 lb

## 2023-09-30 DIAGNOSIS — Z0001 Encounter for general adult medical examination with abnormal findings: Secondary | ICD-10-CM

## 2023-09-30 DIAGNOSIS — D126 Benign neoplasm of colon, unspecified: Secondary | ICD-10-CM

## 2023-09-30 DIAGNOSIS — Z7985 Long-term (current) use of injectable non-insulin antidiabetic drugs: Secondary | ICD-10-CM

## 2023-09-30 DIAGNOSIS — E118 Type 2 diabetes mellitus with unspecified complications: Secondary | ICD-10-CM

## 2023-09-30 DIAGNOSIS — I10 Essential (primary) hypertension: Secondary | ICD-10-CM

## 2023-09-30 DIAGNOSIS — E785 Hyperlipidemia, unspecified: Secondary | ICD-10-CM

## 2023-09-30 DIAGNOSIS — Z0000 Encounter for general adult medical examination without abnormal findings: Secondary | ICD-10-CM

## 2023-09-30 NOTE — Progress Notes (Signed)
 Subjective:    Patient ID: Travis Sanders, male    DOB: 1971-03-11, 53 y.o.   MRN: [id number]  HPI Patient is a very pleasant 53 year old Caucasian gentleman who is here today for a CPE.  He has a history of hypertension, hyperlipidemia, and obstructive sleep apnea.  Last colonoscopy was 2021.  GI recommended repeat colonoscopy in 3 years.  However they did not schedule him for a colonoscopy last year.  I put the order in today and have asked the patient to let me know if he does not hear from the gastroenterologist office.  He is due for the shingles vaccine.  He is due for the pneumonia vaccine as well as a tetanus shot.  He declines these today.  Last year was diagnosed with diabetes mellitus.  Started Ozempic .  Has lost more than 20 pounds.  Hemoglobin A1c has fallen from 6.5-5.9.  I am very proud of the patient for his improvement and the weight loss.  Patient's most recent lab work is listed below Lab on 09/26/2023  Component Date Value Ref Range Status   WBC 09/26/2023 5.6  3.8 - 10.8 Thousand/uL Final   RBC 09/26/2023 4.61  4.20 - 5.80 Million/uL Final   Hemoglobin 09/26/2023 14.2  13.2 - 17.1 g/dL Final   HCT 92/89/7974 42.3  38.5 - 50.0 % Final   MCV 09/26/2023 91.8  80.0 - 100.0 fL Final   MCH 09/26/2023 30.8  27.0 - 33.0 pg Final   MCHC 09/26/2023 33.6  32.0 - 36.0 g/dL Final   Comment: For adults, a slight decrease in the calculated MCHC value (in the range of 30 to 32 g/dL) is most likely not clinically significant; however, it should be interpreted with caution in correlation with other red cell parameters and the patient's clinical condition.    RDW 09/26/2023 12.8  11.0 - 15.0 % Final   Platelets 09/26/2023 295  140 - 400 Thousand/uL Final   MPV 09/26/2023 9.5  7.5 - 12.5 fL Final   Neutro Abs 09/26/2023 3,125  1,500 - 7,800 cells/uL Final   Absolute Lymphocytes 09/26/2023 1,882  850 - 3,900 cells/uL Final   Absolute Monocytes 09/26/2023 442  200 - 950 cells/uL Final    Eosinophils Absolute 09/26/2023 123  15 - 500 cells/uL Final   Basophils Absolute 09/26/2023 28  0 - 200 cells/uL Final   Neutrophils Relative % 09/26/2023 55.8  % Final   Total Lymphocyte 09/26/2023 33.6  % Final   Monocytes Relative 09/26/2023 7.9  % Final   Eosinophils Relative 09/26/2023 2.2  % Final   Basophils Relative 09/26/2023 0.5  % Final   Glucose, Bld 09/26/2023 99  65 - 99 mg/dL Final   Comment: .            Fasting reference interval .    BUN 09/26/2023 22  7 - 25 mg/dL Final   Creat 92/89/7974 1.10  0.70 - 1.30 mg/dL Final   BUN/Creatinine Ratio 09/26/2023 SEE NOTE:  6 - 22 (calc) Final   Comment:    Not Reported: BUN and Creatinine are within    reference range. .    Sodium 09/26/2023 139  135 - 146 mmol/L Final   Potassium 09/26/2023 3.9  3.5 - 5.3 mmol/L Final   Chloride 09/26/2023 101  98 - 110 mmol/L Final   CO2 09/26/2023 30  20 - 32 mmol/L Final   Calcium  09/26/2023 9.4  8.6 - 10.3 mg/dL Final   Total Protein 92/89/7974 6.8  6.1 - 8.1 g/dL Final   Albumin 92/89/7974 4.4  3.6 - 5.1 g/dL Final   Globulin 92/89/7974 2.4  1.9 - 3.7 g/dL (calc) Final   AG Ratio 09/26/2023 1.8  1.0 - 2.5 (calc) Final   Total Bilirubin 09/26/2023 0.6  0.2 - 1.2 mg/dL Final   Alkaline phosphatase (APISO) 09/26/2023 120  35 - 144 U/L Final   AST 09/26/2023 17  10 - 35 U/L Final   ALT 09/26/2023 31  9 - 46 U/L Final   Cholesterol 09/26/2023 152  <200 mg/dL Final   HDL 92/89/7974 50  > OR = 40 mg/dL Final   Triglycerides 92/89/7974 77  <150 mg/dL Final   LDL Cholesterol (Calc) 09/26/2023 85  mg/dL (calc) Final   Comment: Reference range: <100 . Desirable range <100 mg/dL for primary prevention;   <70 mg/dL for patients with CHD or diabetic patients  with > or = 2 CHD risk factors. SABRA LDL-C is now calculated using the Martin-Hopkins  calculation, which is a validated novel method providing  better accuracy than the Friedewald equation in the  estimation of LDL-C.  Gladis APPLETHWAITE  et al. SANDREA. 7986;689(80): 2061-2068  (http://education.[facility].com/faq/FAQ164)    Total CHOL/HDL Ratio 09/26/2023 3.0  <4.9 (calc) Final   Non-HDL Cholesterol (Calc) 09/26/2023 102  <130 mg/dL (calc) Final   Comment: For patients with diabetes plus 1 major ASCVD risk  factor, treating to a non-HDL-C goal of <100 mg/dL  (LDL-C of <29 mg/dL) is considered a therapeutic  option.    Hgb A1c MFr Bld 09/26/2023 5.9 (H)  <5.7 % Final   Comment: For someone without known diabetes, a hemoglobin  A1c value between 5.7% and 6.4% is consistent with prediabetes and should be confirmed with a  follow-up test. . For someone with known diabetes, a value <7% indicates that their diabetes is well controlled. A1c targets should be individualized based on duration of diabetes, age, comorbid conditions, and other considerations. . This assay result is consistent with an increased risk of diabetes. . Currently, no consensus exists regarding use of hemoglobin A1c for diagnosis of diabetes for children. .    Mean Plasma Glucose 09/26/2023 123  mg/dL Final   eAG (mmol/L) 92/89/7974 6.8  mmol/L Final    Past Medical History:  Diagnosis Date   GERD (gastroesophageal reflux disease)    Hyperlipidemia    Hypertension    OSA (obstructive sleep apnea)    Past Surgical History:  Procedure Laterality Date   COLONOSCOPY N/A 11/05/2019   Procedure: COLONOSCOPY;  Surgeon: Golda Claudis PENNER, MD;  Location: [facility];  Service: Endoscopy;  Laterality: N/A;  1030   KNEE ARTHROSCOPY     POLYPECTOMY  11/05/2019   Procedure: POLYPECTOMY;  Surgeon: Golda Claudis PENNER, MD;  Location: [facility];  Service: Endoscopy;;   TONSILLECTOMY     Current Outpatient Medications on File Prior to Visit  Medication Sig Dispense Refill   amLODipine  (NORVASC ) 5 MG tablet Take 1 tablet (5 mg total) by mouth daily. 90 tablet 1   atorvastatin  (LIPITOR) 20 MG tablet Take 1 tablet (20 mg total) by mouth daily. 90  tablet 1   hydrochlorothiazide  (HYDRODIURIL ) 25 MG tablet TAKE 1 TABLET (25 MG TOTAL) BY MOUTH DAILY. 90 tablet 1   losartan  (COZAAR ) 100 MG tablet Take 1 tablet (100 mg total) by mouth daily. 90 tablet 1   Multiple Vitamin (MULTIVITAMIN WITH MINERALS) TABS tablet Take 1 tablet by mouth daily.     omeprazole  (PRILOSEC)  40 MG capsule TAKE 1 CAPSULE BY MOUTH EVERY DAY 90 capsule 1   Semaglutide , 2 MG/DOSE, 8 MG/3ML SOPN Inject 2 mg as directed once a week. 3 mL 5   tadalafil  (CIALIS ) 20 MG tablet TAKE 1/2 TO 1 (ONE-HALF TO ONE) TABLET BY MOUTH EVERY OTHER DAY AS NEEDED FOR ERECTILE DYSFUNCTION . APPOINTMENT REQUIRED FOR FUTURE REFILLS 15 tablet 0   XIIDRA 5 % SOLN Place 1 drop into both eyes 2 (two) times daily.     No current facility-administered medications on file prior to visit.   No Known Allergies Social History   Socioeconomic History   Marital status: Married    Spouse name: Not on file   Number of children: Not on file   Years of education: Not on file   Highest education level: Not on file  Occupational History   Not on file  Tobacco Use   Smoking status: Never   Smokeless tobacco: Never  Substance and Sexual Activity   Alcohol use: Yes    Alcohol/week: 3.0 standard drinks of alcohol    Types: 3 Cans of beer per week    Comment: occasionally   Drug use: No   Sexual activity: Yes  Other Topics Concern   Not on file  Social History Narrative   Not on file   Social Drivers of Health   Financial Resource Strain: Not on file  Food Insecurity: Not on file  Transportation Needs: Not on file  Physical Activity: Not on file  Stress: Not on file  Social Connections: Not on file  Intimate Partner Violence: Not on file   Family History  Problem Relation Age of Onset   Sleep apnea Neg Hx       Review of Systems  All other systems reviewed and are negative.      Objective:   Physical Exam Vitals reviewed.  Constitutional:      General: He is not in acute  distress.    Appearance: He is well-developed. He is not diaphoretic.  HENT:     Head: Normocephalic and atraumatic.     Right Ear: External ear normal.     Left Ear: External ear normal.     Nose: Nose normal.     Mouth/Throat:     Pharynx: No oropharyngeal exudate.  Eyes:     General: No scleral icterus.       Right eye: No discharge.        Left eye: No discharge.     Conjunctiva/sclera: Conjunctivae normal.     Pupils: Pupils are equal, round, and reactive to light.  Neck:     Thyroid: No thyromegaly.     Vascular: No JVD.     Trachea: No tracheal deviation.  Cardiovascular:     Rate and Rhythm: Normal rate and regular rhythm.     Heart sounds: Normal heart sounds. No murmur heard.    No friction rub. No gallop.  Pulmonary:     Effort: Pulmonary effort is normal. No respiratory distress.     Breath sounds: Normal breath sounds. No stridor. No wheezing or rales.  Chest:     Chest wall: No tenderness.  Abdominal:     General: Bowel sounds are normal. There is no distension.     Palpations: Abdomen is soft. There is no mass.     Tenderness: There is no abdominal tenderness. There is no guarding or rebound.  Musculoskeletal:        General: No tenderness. Normal range  of motion.     Cervical back: Normal range of motion and neck supple.  Lymphadenopathy:     Cervical: No cervical adenopathy.  Skin:    General: Skin is warm.     Coloration: Skin is not pale.     Findings: No erythema or rash.  Neurological:     Mental Status: He is alert and oriented to person, place, and time.     Cranial Nerves: No cranial nerve deficit.     Motor: No abnormal muscle tone.     Coordination: Coordination normal.     Deep Tendon Reflexes: Reflexes are normal and symmetric.  Psychiatric:        Behavior: Behavior normal.        Thought Content: Thought content normal.        Judgment: Judgment normal.           Assessment & Plan:  Tubular adenoma of colon - Plan: Ambulatory  referral to Gastroenterology  General medical exam  Benign essential HTN  Controlled type 2 diabetes mellitus with complication, without long-term current use of insulin (HCC)  Hyperlipidemia, unspecified hyperlipidemia type Blood pressure, diabetes, and cholesterol are well-controlled.  We discussed switching Ozempic  to Mounjaro but the patient elects to stay on Ozempic  at the present time.  Add a PSA to screen for prostate cancer.  Schedule colonoscopy.  Recommended the shingles vaccine and the pneumonia shot.  Patient politely declined these today.  Patient does have some pain in his right shoulder.  He has pain with empty can testing and some slight weakness with resisted abduction.  Recommended a trial of physical therapy but the patient politely defers at the present time

## 2023-10-01 ENCOUNTER — Encounter (INDEPENDENT_AMBULATORY_CARE_PROVIDER_SITE_OTHER): Payer: Self-pay | Admitting: *Deleted

## 2023-10-02 LAB — HEMOGLOBIN A1C
Hgb A1c MFr Bld: 5.9 % — ABNORMAL HIGH
Mean Plasma Glucose: 123 mg/dL
eAG (mmol/L): 6.8 mmol/L

## 2023-10-02 LAB — CBC WITH DIFFERENTIAL/PLATELET
Absolute Lymphocytes: 1882 {cells}/uL (ref 850–3900)
Absolute Monocytes: 442 {cells}/uL (ref 200–950)
Basophils Absolute: 28 {cells}/uL (ref 0–200)
Basophils Relative: 0.5 %
Eosinophils Absolute: 123 {cells}/uL (ref 15–500)
Eosinophils Relative: 2.2 %
HCT: 42.3 % (ref 38.5–50.0)
Hemoglobin: 14.2 g/dL (ref 13.2–17.1)
MCH: 30.8 pg (ref 27.0–33.0)
MCHC: 33.6 g/dL (ref 32.0–36.0)
MCV: 91.8 fL (ref 80.0–100.0)
MPV: 9.5 fL (ref 7.5–12.5)
Monocytes Relative: 7.9 %
Neutro Abs: 3125 {cells}/uL (ref 1500–7800)
Neutrophils Relative %: 55.8 %
Platelets: 295 Thousand/uL (ref 140–400)
RBC: 4.61 Million/uL (ref 4.20–5.80)
RDW: 12.8 % (ref 11.0–15.0)
Total Lymphocyte: 33.6 %
WBC: 5.6 Thousand/uL (ref 3.8–10.8)

## 2023-10-02 LAB — TEST AUTHORIZATION

## 2023-10-02 LAB — LIPID PANEL
Cholesterol: 152 mg/dL (ref <200)
HDL: 50 mg/dL (ref >=40)
LDL Cholesterol (Calc): 85 mg/dL
Non-HDL Cholesterol (Calc): 102 mg/dL (ref <130)
Total CHOL/HDL Ratio: 3 (calc) (ref <5.0)
Triglycerides: 77 mg/dL (ref <150)

## 2023-10-02 LAB — COMPLETE METABOLIC PANEL WITHOUT GFR
AG Ratio: 1.8 (calc) (ref 1.0–2.5)
ALT: 31 U/L (ref 9–46)
AST: 17 U/L (ref 10–35)
Albumin: 4.4 g/dL (ref 3.6–5.1)
Alkaline phosphatase (APISO): 120 U/L (ref 35–144)
BUN: 22 mg/dL (ref 7–25)
CO2: 30 mmol/L (ref 20–32)
Calcium: 9.4 mg/dL (ref 8.6–10.3)
Chloride: 101 mmol/L (ref 98–110)
Creat: 1.1 mg/dL (ref 0.70–1.30)
Globulin: 2.4 g/dL (ref 1.9–3.7)
Glucose, Bld: 99 mg/dL (ref 65–99)
Potassium: 3.9 mmol/L (ref 3.5–5.3)
Sodium: 139 mmol/L (ref 135–146)
Total Bilirubin: 0.6 mg/dL (ref 0.2–1.2)
Total Protein: 6.8 g/dL (ref 6.1–8.1)

## 2023-10-02 LAB — PSA: PSA: 0.55 ng/mL (ref <=4.00)

## 2023-11-08 ENCOUNTER — Other Ambulatory Visit: Payer: Self-pay | Admitting: Family Medicine

## 2023-11-10 ENCOUNTER — Other Ambulatory Visit: Payer: Self-pay | Admitting: Family Medicine

## 2023-11-11 NOTE — Telephone Encounter (Signed)
 Requested Prescriptions  Pending Prescriptions Disp Refills   tadalafil  (CIALIS ) 20 MG tablet [Pharmacy Med Name: Tadalafil  20 MG Oral Tablet] 15 tablet 1    Sig: TAKE 1/2 TO 1 (ONE-HALF TO ONE) TABLET BY MOUTH EVERY OTHER DAY AS NEEDED FOR  ERECTILE  DYSFUNCTION     Urology: Erectile Dysfunction Agents Passed - 11/11/2023 12:10 PM      Passed - AST in normal range and within 360 days    AST  Date Value Ref Range Status  09/26/2023 17 10 - 35 U/L Final         Passed - ALT in normal range and within 360 days    ALT  Date Value Ref Range Status  09/26/2023 31 9 - 46 U/L Final         Passed - Last BP in normal range    BP Readings from Last 1 Encounters:  09/30/23 136/82         Passed - Valid encounter within last 12 months    Recent Outpatient Visits           1 month ago Tubular adenoma of colon   [place] [facility] Duanne Butler DASEN, MD   6 months ago Controlled type 2 diabetes mellitus with complication, without long-term current use of insulin [facility])   [place] [facility] Pickard, Butler DASEN, MD   1 year ago General medical exam   [place] [facility] Duanne Butler DASEN, MD   2 years ago Benign essential HTN   [place] [facility] Pickard, Butler DASEN, MD

## 2023-11-12 NOTE — Telephone Encounter (Signed)
 Requested Prescriptions  Pending Prescriptions Disp Refills   Semaglutide , 2 MG/DOSE, (OZEMPIC , 2 MG/DOSE,) 8 MG/3ML SOPN [Pharmacy Med Name: OZEMPIC  8 MG/3 ML (2 MG/DOSE)] 3 mL 5    Sig: INJECT 2 MG AS DIRECTED ONCE A WEEK.     Endocrinology:  Diabetes - GLP-1 Receptor Agonists - semaglutide  Failed - 11/12/2023  9:35 AM      Failed - HBA1C in normal range and within 180 days    Hgb A1c MFr Bld  Date Value Ref Range Status  09/26/2023 5.9 (H) <5.7 % Final    Comment:    For someone without known diabetes, a hemoglobin  A1c value between 5.7% and 6.4% is consistent with prediabetes and should be confirmed with a  follow-up test. . For someone with known diabetes, a value <7% indicates that their diabetes is well controlled. A1c targets should be individualized based on duration of diabetes, age, comorbid conditions, and other considerations. . This assay result is consistent with an increased risk of diabetes. . Currently, no consensus exists regarding use of hemoglobin A1c for diagnosis of diabetes for children. .          Passed - Cr in normal range and within 360 days    Creat  Date Value Ref Range Status  09/26/2023 1.10 0.70 - 1.30 mg/dL Final   Creatinine, Urine  Date Value Ref Range Status  05/13/2023 491 (H) 20 - 320 mg/dL Final    Comment:    Verified by repeat analysis. SABRA Amy - Valid encounter within last 6 months    Recent Outpatient Visits           1 month ago Tubular adenoma of colon   [place] [facility] Duanne Butler DASEN, MD   6 months ago Controlled type 2 diabetes mellitus with complication, without long-term current use of insulin [facility])   [place] [facility] Pickard, Butler DASEN, MD   1 year ago General medical exam   [place] [facility] Duanne Butler DASEN, MD   2 years ago Benign essential HTN    [facility] Pickard, Butler DASEN, MD

## 2023-11-13 ENCOUNTER — Other Ambulatory Visit: Payer: Self-pay | Admitting: Family Medicine

## 2023-11-13 DIAGNOSIS — K219 Gastro-esophageal reflux disease without esophagitis: Secondary | ICD-10-CM

## 2023-11-13 DIAGNOSIS — I10 Essential (primary) hypertension: Secondary | ICD-10-CM

## 2023-11-13 DIAGNOSIS — E785 Hyperlipidemia, unspecified: Secondary | ICD-10-CM

## 2024-01-29 ENCOUNTER — Encounter: Payer: Self-pay | Admitting: Family Medicine

## 2024-01-30 ENCOUNTER — Other Ambulatory Visit: Payer: Self-pay | Admitting: Family Medicine

## 2024-01-30 MED ORDER — TIRZEPATIDE 5 MG/0.5ML ~~LOC~~ SOAJ: 5.0000 mg | SUBCUTANEOUS | 3 refills | Status: AC

## 2024-01-31 ENCOUNTER — Other Ambulatory Visit (HOSPITAL_COMMUNITY): Payer: Self-pay

## 2024-01-31 ENCOUNTER — Telehealth: Payer: Self-pay | Admitting: Pharmacy Technician

## 2024-01-31 NOTE — Telephone Encounter (Signed)
 Pharmacy Patient Advocate Encounter  Received notification from [facility] that Prior Authorization for Mounjaro 5MG /0.5ML auto-injectors has been DENIED.  No reason given; No denial letter received via Fax or CMM. It has been requested and will be uploaded to the media tab once received.   PA #/Case ID/Reference #: [id number]

## 2024-01-31 NOTE — Telephone Encounter (Signed)
 Pharmacy Patient Advocate Encounter   Received notification from [employer] that prior authorization for Mounjaro 5MG /0.5ML auto-injectors is required/requested.   Insurance verification completed.   The patient is insured through [employer].   Per test claim: PA required; PA started via CoverMyMeds. KEY [id number] . Waiting for clinical questions to populate.

## 2024-01-31 NOTE — Telephone Encounter (Signed)
 Prior Authorization form/request asks a question that requires your assistance. Please see the question below and advise accordingly. The PA will not be submitted until the necessary information is received.     I can not find any info that he has tried metformin, I can just about guarantee this will get denied if he hasn't tried metformin in the past.

## 2024-01-31 NOTE — Telephone Encounter (Signed)
 Good morning dear, thanks for checking behind me!! Should I go ahead and submit the PA??

## 2024-02-09 ENCOUNTER — Other Ambulatory Visit: Payer: Self-pay | Admitting: Family Medicine

## 2024-02-09 DIAGNOSIS — I10 Essential (primary) hypertension: Secondary | ICD-10-CM

## 2024-02-10 ENCOUNTER — Other Ambulatory Visit (HOSPITAL_COMMUNITY): Payer: Self-pay

## 2024-02-10 ENCOUNTER — Telehealth: Payer: Self-pay

## 2024-02-10 NOTE — Telephone Encounter (Signed)
 Requested Prescriptions  Pending Prescriptions Disp Refills   hydrochlorothiazide  (HYDRODIURIL ) 25 MG tablet [Pharmacy Med Name: HYDROCHLOROTHIAZIDE  25 MG TAB] 90 tablet 0    Sig: TAKE 1 TABLET (25 MG TOTAL) BY MOUTH DAILY.     Cardiovascular: Diuretics - Thiazide Passed - 02/10/2024  4:21 PM      Passed - Cr in normal range and within 180 days    Creat  Date Value Ref Range Status  09/26/2023 1.10 0.70 - 1.30 mg/dL Final   Creatinine, Urine  Date Value Ref Range Status  05/13/2023 491 (H) 20 - 320 mg/dL Final    Comment:    Verified by repeat analysis. .          Passed - K in normal range and within 180 days    Potassium  Date Value Ref Range Status  09/26/2023 3.9 3.5 - 5.3 mmol/L Final         Passed - Na in normal range and within 180 days    Sodium  Date Value Ref Range Status  09/26/2023 139 135 - 146 mmol/L Final         Passed - Last BP in normal range    BP Readings from Last 1 Encounters:  09/30/23 136/82         Passed - Valid encounter within last 6 months    Recent Outpatient Visits           4 months ago Tubular adenoma of colon   [place] [facility] Duanne Butler DASEN, MD   9 months ago Controlled type 2 diabetes mellitus with complication, without long-term current use of insulin [facility])   [place] [facility] Pickard, Butler DASEN, MD   1 year ago General medical exam   [place] [facility] Duanne Butler DASEN, MD   2 years ago Benign essential HTN   [place] [facility] Pickard, Butler DASEN, MD

## 2024-02-10 NOTE — Telephone Encounter (Signed)
 Pharmacy Patient Advocate Encounter   Received notification from [employer] that prior authorization for Mounjaro  2.5MG /0.5ML auto-injectors is required/requested.   Insurance verification completed.   The patient is insured through [employer].   Per test claim: PA required; PA submitted to above mentioned insurance via Latent Key/confirmation #/EOC [id number] Status is pending

## 2024-02-10 NOTE — Telephone Encounter (Signed)
 Pharmacy Patient Advocate Encounter  Received notification from [facility] that Prior Authorization for  Mounjaro  2.5MG /0.5ML auto-injectors has been DENIED.  Full denial letter will be uploaded to the media tab. See denial reason below.   PA #/Case ID/Reference #: [id number]

## 2024-02-10 NOTE — Telephone Encounter (Signed)
 Hi, My chart message received from pt for PA on Mounjaro . Thank you!  I received a rejection letter from [employer] for the Mounjaro , it looks like we could possibly submit additional information showing my history and I did have a 6.5 A1C before taking ozempic  to lower it to it's current level. Can someone please look into this and call or message me to discuss? I took my final dose of ozempic  last night and need to get this solved ASAP. Thank you for anything you can do to help. My number is [phone] if needed. Thanks again

## 2024-02-11 ENCOUNTER — Other Ambulatory Visit (HOSPITAL_BASED_OUTPATIENT_CLINIC_OR_DEPARTMENT_OTHER): Payer: Self-pay

## 2024-02-18 ENCOUNTER — Other Ambulatory Visit: Payer: Self-pay

## 2024-02-18 DIAGNOSIS — E118 Type 2 diabetes mellitus with unspecified complications: Secondary | ICD-10-CM

## 2024-02-18 MED ORDER — METFORMIN HCL 500 MG PO TABS: 500.0000 mg | ORAL_TABLET | Freq: Two times a day (BID) | ORAL | 0 refills | Status: DC

## 2024-03-05 ENCOUNTER — Other Ambulatory Visit (HOSPITAL_COMMUNITY): Payer: Self-pay

## 2024-03-10 ENCOUNTER — Telehealth: Payer: Self-pay

## 2024-03-10 ENCOUNTER — Ambulatory Visit: Admitting: Family Medicine

## 2024-03-10 ENCOUNTER — Other Ambulatory Visit: Payer: Self-pay

## 2024-03-10 ENCOUNTER — Encounter (HOSPITAL_COMMUNITY): Payer: Self-pay

## 2024-03-10 ENCOUNTER — Emergency Department (HOSPITAL_COMMUNITY)

## 2024-03-10 ENCOUNTER — Encounter: Payer: Self-pay | Admitting: Family Medicine

## 2024-03-10 ENCOUNTER — Emergency Department (HOSPITAL_COMMUNITY)
Admission: EM | Admit: 2024-03-10 | Discharge: 2024-03-10 | Disposition: A | Discharge status: Home / Self Care | Attending: Emergency Medicine | Admitting: Emergency Medicine

## 2024-03-10 VITALS — BP 138/80 | HR 84 | Ht 73.0 in | Wt 288.0 lb

## 2024-03-10 DIAGNOSIS — R079 Chest pain, unspecified: Secondary | ICD-10-CM

## 2024-03-10 DIAGNOSIS — R0602 Shortness of breath: Secondary | ICD-10-CM | POA: Insufficient documentation

## 2024-03-10 DIAGNOSIS — E119 Type 2 diabetes mellitus without complications: Secondary | ICD-10-CM

## 2024-03-10 DIAGNOSIS — Z8249 Family history of ischemic heart disease and other diseases of the circulatory system: Secondary | ICD-10-CM

## 2024-03-10 DIAGNOSIS — R0781 Pleurodynia: Secondary | ICD-10-CM | POA: Diagnosis not present

## 2024-03-10 DIAGNOSIS — Z7984 Long term (current) use of oral hypoglycemic drugs: Secondary | ICD-10-CM

## 2024-03-10 DIAGNOSIS — I10 Essential (primary) hypertension: Secondary | ICD-10-CM | POA: Diagnosis not present

## 2024-03-10 DIAGNOSIS — E785 Hyperlipidemia, unspecified: Secondary | ICD-10-CM | POA: Diagnosis not present

## 2024-03-10 DIAGNOSIS — R0789 Other chest pain: Secondary | ICD-10-CM | POA: Diagnosis present

## 2024-03-10 LAB — CBC WITH DIFFERENTIAL/PLATELET
Abs Immature Granulocytes: 0.03 K/uL (ref 0.00–0.07)
Basophils Absolute: 0 K/uL (ref 0.0–0.1)
Basophils Relative: 1 %
Eosinophils Absolute: 0 K/uL (ref 0.0–0.5)
Eosinophils Relative: 1 %
HCT: 44.6 % (ref 39.0–52.0)
Hemoglobin: 15.2 g/dL (ref 13.0–17.0)
Immature Granulocytes: 1 %
Lymphocytes Relative: 22 %
Lymphs Abs: 1.4 K/uL (ref 0.7–4.0)
MCH: 30.9 pg (ref 26.0–34.0)
MCHC: 34.1 g/dL (ref 30.0–36.0)
MCV: 90.7 fL (ref 80.0–100.0)
Monocytes Absolute: 0.4 K/uL (ref 0.1–1.0)
Monocytes Relative: 6 %
Neutro Abs: 4.5 K/uL (ref 1.7–7.7)
Neutrophils Relative %: 69 %
Platelets: 314 K/uL (ref 150–400)
RBC: 4.92 MIL/uL (ref 4.22–5.81)
RDW: 12.6 % (ref 11.5–15.5)
WBC: 6.3 K/uL (ref 4.0–10.5)
nRBC: 0 % (ref 0.0–0.2)

## 2024-03-10 LAB — BASIC METABOLIC PANEL WITH GFR
Anion gap: 12 (ref 5–15)
BUN: 23 mg/dL — ABNORMAL HIGH (ref 6–20)
CO2: 25 mmol/L (ref 22–32)
Calcium: 9.6 mg/dL (ref 8.9–10.3)
Chloride: 102 mmol/L (ref 98–111)
Creatinine, Ser: 1.17 mg/dL (ref 0.61–1.24)
GFR, Estimated: >60 mL/min
Glucose, Bld: 123 mg/dL — ABNORMAL HIGH (ref 70–99)
Potassium: 4.1 mmol/L (ref 3.5–5.1)
Sodium: 139 mmol/L (ref 135–145)

## 2024-03-10 LAB — TROPONIN T, HIGH SENSITIVITY
Troponin T High Sensitivity: <15 ng/L (ref 0–19)
Troponin T High Sensitivity: <15 ng/L (ref 0–19)

## 2024-03-10 LAB — MAGNESIUM: Magnesium: 2.2 mg/dL (ref 1.7–2.4)

## 2024-03-10 LAB — D-DIMER, QUANTITATIVE: D-Dimer, Quant: 0.45 ug{FEU}/mL (ref 0.00–0.50)

## 2024-03-10 MED ORDER — ASPIRIN 81 MG PO CHEW
324.0000 mg | CHEWABLE_TABLET | Freq: Once | ORAL | Status: AC
  Administered 2024-03-10: 324 mg via ORAL
  Filled 2024-03-10: qty 4

## 2024-03-10 NOTE — Telephone Encounter (Signed)
 EDREI NORGAARD to Me     03/10/24 11:56 AM It's not really pain, more of a strange pressure and a little shortness of breath. If I had chest pain I would've gone to the ER days ago, I've had it since the middle of last week. I don't have any fatigue and I haven't been unable to do anything I normally do because of it. When it started I honestly felt like it was an upper respiratory thing coming on but it doesn't seem to be. I read that amlodipine  can cause very similar symptoms to mine too which I do take daily . I made the appointment just to get checked out and discuss with the doctor out of precaution. I recently had to stop ozempic  cold turkey due to insurance reasons and I wasn't sure if it could be related to that as well. I'd just feel better getting checked out today to make sure the doctor isn't concerned about any of it. Me to THERESA DOHRMAN     03/10/24 11:42 AM Hi Cordella,    Dr Aletha wanted me to reach out and see if she could get more info on the chest pain that you are having? When did the pain start and are you having any other symptoms? Please let me know.    Thank you.  Last read by [name] at 11:44AM on 03/10/2024.

## 2024-03-10 NOTE — ED Provider Notes (Signed)
 " [facility] Provider Note   HPI/ROS    History obtained from patient and EMR.  Travis Sanders is a 53 y.o. male who presents for Chest Pain and who  has a past medical history of GERD (gastroesophageal reflux disease), Hyperlipidemia, Hypertension, and OSA (obstructive sleep apnea).  Patient presents today for 5 days of ongoing chest pressure associated with some shortness of breath.  States that it is worse in the morning and then progressively gets better throughout the day.  Denies any fevers, chills, nausea, vomiting, diarrhea.  Denies any cough productive of sputum.  Does endorse some mild headaches throughout the week but denies any visual changes, numbness or tingling extremities, or difficulty ambulating.  Was seen at his PCP today because he wanted to get checked out because his dad has a history of a heart attack, and was sent here for an abnormal EKG.  He states that when he takes a big deep breath and it causes his chest pain to worsen.  Denies any history of blood clots.   MDM   I have reviewed the nursing documentation, vital signs, as well as the past medical history, surgical history, family history, and social history.  Initial Assessment:  Patient hemodynamically stable on initial evaluation with no hypoxia, tachypnea, tachycardia, or hypotension.  EKG from PCP office normal sinus rhythm with 1 PVC noted, otherwise no obvious ischemia, dysrhythmia, or high-grade AV block.  Repeat EKG here normal sinus rhythm with no PVCs, dysrhythmia, ischemia, or high-grade AV block.  Patient having pleuritic chest pain, but no signs of pericarditis on EKG.    Labs obtained in first look with no significant leukocytosis, anemia, or thrombocytopenia on CBC.  Metabolic panel with no AKI or significant electrolyte abnormalities.  Magnesium within normal limits at 2.2.  And troponin negative x 2.  Chest x-ray with no focal airspace disease,  subdiaphragmatic air, pneumothorax, or widened mediastinum.  Lower concern for ACS given nonischemic EKG and negative troponin X2.  No signs of pneumonia or pneumothorax on chest x-ray that would be consistent with pleuritic chest pain. Given patient having pleuritic chest pain will evaluate with D-dimer for PE.  Patient was already given aspirin  upon arrival to the ED. heart score of 3.  Low risk of MACE per the heart score.  D-dimer here negative.  This is reassuring and given unremarkable D-dimer have lower concern for PE or aortic pathology.  Discussed in depth with patient that we are unsure exactly what is causing his pleuritic chest pain.  He is understanding this but was happy to hear that we ruled out emergent conditions.  Please follow-up with his PCP in the next week.  Also sent ambulatory referral to cardiology given this pleuritic chest pain with no known cause.  Patient plans to follow-up with both of them in the outpatient setting.  Patient discharged home in hemodynamically stable condition with strict turn precautions.  He plans to return to the emergency department for any further concerns of chest pain or shortness of breath.  He also plans return for any other concerns.   Disposition:  I discussed the plan for discharge with the patient and/or their surrogate at bedside prior to discharge and they were in agreement with the plan and verbalized understanding of the return precautions provided. All questions answered to the best of my ability. Ultimately, the patient was discharged in stable condition with stable vital signs. I am reassured that they are capable of  close follow up and good social support at home.   This patient was staffed with Dr. Rogelia who supervised the visit and agreed with the plan of care.  Due to the patients current presenting symptoms, physical exam findings, and the workup stated above, it is thought that the etiology of the patients current presentation is:   1. Pleuritic chest pain     Clinical Complexity A medically appropriate history, review of systems, and physical exam was performed.  Factors that affect the complexity of this encounter: assessment of correct protocol, laboratory work from this visit, and review of echocardiogram/EKG results  My independent interpretations of diagnostic studies are documented in the ED course above.   If decision rules were used in this patient's evaluation, they are listed below.   Click here for ABCD2, HEART and other calculators  Patient's presentation is most consistent with acute illness / injury with system symptoms.  MDM generated using voice dictation software and may contain dictation errors. Please contact me for any clarification or with any questions.    Physical Exam, PMH, PSH, Family History, and Social Hsitory   Vitals:   03/10/24 1523 03/10/24 1958 03/10/24 2006 03/10/24 2354  BP:  (!) 143/101  (!) 138/90  Pulse:  76  80  Resp:  15  16  Temp:   97.9 F (36.6 C)   TempSrc:   Oral   SpO2:  97%  100%  Weight: 128.4 kg     Height: 6' (1.829 m)       Physical Exam Constitutional:      Appearance: He is well-developed.  HENT:     Head: Normocephalic and atraumatic.  Cardiovascular:     Rate and Rhythm: Normal rate and regular rhythm.     Pulses:          Radial pulses are 2+ on the right side and 2+ on the left side.     Heart sounds: Normal heart sounds.  Pulmonary:     Effort: Pulmonary effort is normal. No tachypnea.     Breath sounds: No decreased breath sounds, wheezing or rhonchi.  Chest:     Chest wall: No tenderness.  Abdominal:     General: There is no abdominal bruit.     Palpations: There is no mass.     Tenderness: There is no abdominal tenderness. There is no rebound.  Musculoskeletal:     Right lower leg: No edema.     Left lower leg: No edema.  Skin:    General: Skin is warm and dry.     Capillary Refill: Capillary refill takes less than 2 seconds.   Neurological:     General: No focal deficit present.     Mental Status: He is alert and oriented to person, place, and time.     Past Medical History:  Diagnosis Date   GERD (gastroesophageal reflux disease)    Hyperlipidemia    Hypertension    OSA (obstructive sleep apnea)      Past Surgical History:  Procedure Laterality Date   COLONOSCOPY N/A 11/05/2019   Procedure: COLONOSCOPY;  Surgeon: Golda Claudis PENNER, MD;  Location: [facility];  Service: Endoscopy;  Laterality: N/A;  1030   KNEE ARTHROSCOPY     POLYPECTOMY  11/05/2019   Procedure: POLYPECTOMY;  Surgeon: Golda Claudis PENNER, MD;  Location: [facility];  Service: Endoscopy;;   TONSILLECTOMY       Family History  Problem Relation Age of Onset   Heart attack  Father    Sleep apnea Neg Hx     Social History   Tobacco Use   Smoking status: Never   Smokeless tobacco: Never  Substance Use Topics   Alcohol use: Yes    Alcohol/week: 3.0 standard drinks of alcohol    Types: 3 Cans of beer per week    Comment: occasionally     Procedures   If procedures were preformed on this patient, they are listed below:  Procedures   Electronically signed by:   Glendia Carlin Ancona, M.D. PGY-2, Emergency Medicine   Please note that this documentation was produced with the assistance of voice-to-text technology and may contain errors.    Ancona Glendia, MD 03/11/24 0010    Rogelia Jerilynn RAMAN, MD 03/11/24 1610  "

## 2024-03-10 NOTE — ED Provider Triage Note (Signed)
 Emergency Medicine Provider Triage Evaluation Note  Travis Sanders , a 53 y.o. male  was evaluated in triage.  Pt complains of chest pain that has been ongoing for the past 4 to 5 days with some associated shortness of breath.  History of angina and had a reassuring heart cath years ago.  Has hypertension, and borderline diabetes that was diagnosed last year otherwise no known health problems.  Compliant with home medicines.  Has not had aspirin .  Was sent over by PCP for abnormal EKG today.  Review of Systems  Positive: As above Negative: As above  Physical Exam  BP (!) 161/82 (BP Location: Right Arm)   Pulse 86   Temp 98.6 F (37 C)   Resp 16   Ht 6' (1.829 m)   Wt 128.4 kg   SpO2 100%   BMI 38.38 kg/m  Gen:   Awake, no distress   Resp:  Normal effort  MSK:   Moves extremities without difficulty  Other:    Medical Decision Making  Medically screening exam initiated at 3:26 PM.  Appropriate orders placed.  Travis Sanders was informed that the remainder of the evaluation will be completed by another provider, this initial triage assessment does not replace that evaluation, and the importance of remaining in the ED until their evaluation is complete.    Hildegard Loge, PA-C 03/10/24 1526

## 2024-03-10 NOTE — ED Triage Notes (Signed)
 Pt has had chest pain and sob for several days and was seen at his PCP today where he had a EKG and they felt it was abnormal and sent him POV to ED for further evaluation

## 2024-03-10 NOTE — Progress Notes (Signed)
 "  Patient Office Visit  Assessment & Plan:  Chest pain, unspecified type  Hyperlipidemia, unspecified hyperlipidemia type  Controlled type 2 diabetes mellitus without complication, without long-term current use of insulin (HCC)  Family history of heart disease in male family member before age 55   Assessment and Plan        Chest pain, suspicious for cardiac etiology with risk factors and FH CAD  Intermittent chest discomfort with concerning symptoms for cardiac etiology. High risk due to family history and diabetes. Normal EKG but further evaluation needed. Patient aware EKG looks good with PVCs - Referred to ER for evaluation of chest pain and possible acute coronary syndrome. - Patient will go to [facility] ED for further evaluation . - Provided copy of EKG for ER evaluation.  Type 2 diabetes mellitus Recently diagnosed, previously managed with Ozempic , discontinued due to insurance issues.  Gastroesophageal reflux disease Chronic condition managed with medication.    EKG- NSR, no acute changes noted, PVCs noted in EKG- patient notified and given copy.  Recommend patient go to ED for further evaluation- he agreed to do so.  Return if symptoms worsen or fail to improve.   Subjective:    Patient ID: Travis Sanders, male    DOB: 1970/12/12  Age: 53 y.o. MRN: [id number]  Chief Complaint  Patient presents with   Chest Pain    Pt reports a tightness in his chest. It is on both sides. Pt reports being able to do his normal activities.      Chest Pain    Discussed the use of AI scribe software for clinical note transcription with the patient, who gave verbal consent to proceed.  History of Present Illness        History of Present Illness Travis Sanders is a 53 year old male with a history of diabetes who presents with chest discomfort/chest pain for the past week.   He has been experiencing chest discomfort since the middle of last week, describing it as a  pressure sensation, akin to a 'fat cat' sitting on his chest. The discomfort does not disturb his sleep or worsen with physical activity, such as working on cars. No associated nausea, vomiting, or sweating. He experiences occasional palpitations, described as 'occasional hard beats,' which have been documented on a previous EKG as extra beats. He denies significant caffeine or energy drink consumption, consuming only one cup of coffee per day. Patient did have a cath over 10 years ago and was negative per patient. Patient also having bilateral shoulder and jaw pain with this chest pain. He does have reflux but this discomfort/pain is different  He has a history of angina diagnosed in 2013, for which he underwent a heart catheterization that showed no significant blockages at that time. No previous history of blood clots. He recently stopped taking Ozempic  due to insurance issues, despite experiencing significant weight loss while on the medication. He experiences shortness of breath, which does not limit his activities, and occasional headaches. He also reports feeling better in the evenings. He uses a CPAP machine for sleep apnea and sleeps on his sides, which sometimes causes shoulder and arm discomfort.  He has a family history of heart disease; his biological father died of a heart attack at age 46, although he was a heavy smoker and alcoholic. He does not smoke or drink alcohol. He mentions experiencing reflux for which he takes over-the-counter medication. He denies any recent exposure to COVID-19 and has not  experienced any respiratory symptoms such as coughing or wheezing. patient wanted to make sure it was not his heart.  Physical Exam VITALS: SaO2- 98% GENERAL: No acute distress. CHEST: Lungs clear to auscultation bilaterally.  Results Diagnostic EKG (03/10/2024): Occasional premature beats, otherwise normal, no atrial fibrillation (Independently interpreted)  Assessment and Plan Chest  pain suspicious for cardiac etiology Intermittent chest discomfort with concerning symptoms for cardiac etiology. High risk due to family history and diabetes. Normal EKG with PVCs but further evaluation needed due to risk factors.  - Referred to ER for evaluation of chest pain  - Advised to state 'chest pain' upon ER arrival for expedited evaluation. - Provided copy of EKG for ER evaluation.  Type 2 diabetes mellitus Recently diagnosed, previously managed with Ozempic , discontinued due to insurance issues.  Gastroesophageal reflux disease Chronic condition managed with medication.    The 10-year ASCVD risk score (Arnett DK, et al., 2019) is: 10.3%  Past Medical History:  Diagnosis Date   GERD (gastroesophageal reflux disease)    Hyperlipidemia    Hypertension    OSA (obstructive sleep apnea)    Past Surgical History:  Procedure Laterality Date   COLONOSCOPY N/A 11/05/2019   Procedure: COLONOSCOPY;  Surgeon: Golda Claudis PENNER, MD;  Location: [facility];  Service: Endoscopy;  Laterality: N/A;  1030   KNEE ARTHROSCOPY     POLYPECTOMY  11/05/2019   Procedure: POLYPECTOMY;  Surgeon: Golda Claudis PENNER, MD;  Location: [facility];  Service: Endoscopy;;   TONSILLECTOMY     Social History[1] Family History  Problem Relation Age of Onset   Heart attack Father    Sleep apnea Neg Hx    Allergies[2]  Review of Systems  Cardiovascular:  Positive for chest pain.      Objective:    BP 138/80   Pulse 84   Ht 6' 1 (1.854 m)   Wt 288 lb (130.6 kg)   SpO2 98%   BMI 38.00 kg/m  BP Readings from Last 3 Encounters:  03/10/24 (!) 138/90  03/10/24 138/80  09/30/23 136/82   Wt Readings from Last 3 Encounters:  03/10/24 283 lb (128.4 kg)  03/10/24 288 lb (130.6 kg)  09/30/23 287 lb 9.6 oz (130.5 kg)    Physical Exam Vitals and nursing note reviewed.  Constitutional:      General: He is not in acute distress.    Appearance: Normal appearance.  HENT:     Head:  Normocephalic.     Right Ear: Tympanic membrane, ear canal and external ear normal.     Left Ear: Tympanic membrane, ear canal and external ear normal.  Eyes:     Extraocular Movements: Extraocular movements intact.     Conjunctiva/sclera: Conjunctivae normal.     Pupils: Pupils are equal, round, and reactive to light.  Cardiovascular:     Rate and Rhythm: Normal rate and regular rhythm.     Heart sounds: Normal heart sounds.     Comments: No tenderness over the chest area to palpation.  Pulmonary:     Effort: Pulmonary effort is normal. No respiratory distress.     Breath sounds: Normal breath sounds. No wheezing or rhonchi.  Musculoskeletal:     Right lower leg: No edema.     Left lower leg: No edema.  Neurological:     General: No focal deficit present.     Mental Status: He is alert and oriented to person, place, and time.  Psychiatric:  Mood and Affect: Mood normal.        Behavior: Behavior normal.        Thought Content: Thought content normal.      Results for orders placed or performed during the hospital encounter of 03/10/24  Basic metabolic panel with GFR  Result Value Ref Range   Sodium 139 135 - 145 mmol/L   Potassium 4.1 3.5 - 5.1 mmol/L   Chloride 102 98 - 111 mmol/L   CO2 25 22 - 32 mmol/L   Glucose, Bld 123 (H) 70 - 99 mg/dL   BUN 23 (H) 6 - 20 mg/dL   Creatinine, Ser 8.82 0.61 - 1.24 mg/dL   Calcium  9.6 8.9 - 10.3 mg/dL   GFR, Estimated >39 >39 mL/min   Anion gap 12 5 - 15  CBC with Differential/Platelet  Result Value Ref Range   WBC 6.3 4.0 - 10.5 K/uL   RBC 4.92 4.22 - 5.81 MIL/uL   Hemoglobin 15.2 13.0 - 17.0 g/dL   HCT 55.3 60.9 - 47.9 %   MCV 90.7 80.0 - 100.0 fL   MCH 30.9 26.0 - 34.0 pg   MCHC 34.1 30.0 - 36.0 g/dL   RDW 87.3 88.4 - 84.4 %   Platelets 314 150 - 400 K/uL   nRBC 0.0 0.0 - 0.2 %   Neutrophils Relative % 69 %   Neutro Abs 4.5 1.7 - 7.7 K/uL   Lymphocytes Relative 22 %   Lymphs Abs 1.4 0.7 - 4.0 K/uL   Monocytes  Relative 6 %   Monocytes Absolute 0.4 0.1 - 1.0 K/uL   Eosinophils Relative 1 %   Eosinophils Absolute 0.0 0.0 - 0.5 K/uL   Basophils Relative 1 %   Basophils Absolute 0.0 0.0 - 0.1 K/uL   Immature Granulocytes 1 %   Abs Immature Granulocytes 0.03 0.00 - 0.07 K/uL  Magnesium  Result Value Ref Range   Magnesium 2.2 1.7 - 2.4 mg/dL  D-dimer, quantitative  Result Value Ref Range   D-Dimer, Quant 0.45 0.00 - 0.50 ug/mL-FEU  Troponin T, High Sensitivity  Result Value Ref Range   Troponin T High Sensitivity <15 0 - 19 ng/L  Troponin T, High Sensitivity  Result Value Ref Range   Troponin T High Sensitivity <15 0 - 19 ng/L            [1]  Social History Tobacco Use   Smoking status: Never   Smokeless tobacco: Never  Substance Use Topics   Alcohol use: Yes    Alcohol/week: 3.0 standard drinks of alcohol    Types: 3 Cans of beer per week    Comment: occasionally   Drug use: No  [2] No Known Allergies  "

## 2024-03-10 NOTE — Discharge Instructions (Signed)
 You were seen today for chest pain. While you were here we monitored your vitals, preformed a physical exam, and labs and imaging. These were all reassuring and there is no indication for any further testing or intervention in the emergency department at this time.   Things to do:  - Follow up with your primary care provider within the next 1-2 weeks - We sent your ambulatory referral for cardiology, please expect their call and plan to follow-up with them in the outpatient setting  Return to the emergency department if you have any new or worsening symptoms including recurrent chest pain, shortness of breath, chest pain is worse on exertion, inability tolerate p.o., fevers not amenable to Tylenol or Motrin, or if you have any other concerns.

## 2024-03-10 NOTE — Telephone Encounter (Signed)
 Routed message to Dr Aletha.

## 2024-03-10 NOTE — Telephone Encounter (Signed)
-----   Message from Connie Emperor, MD sent at 03/10/2024 11:39 AM EST ----- Regarding: chest pain Patient having chest pain- this could be his heart and the best place to evaluate this is the ER.  We cannot do stat labs here to determine that it is a heart attack.  Just wanted to do the right thing for him.  Thanks.

## 2024-03-11 ENCOUNTER — Encounter: Payer: Self-pay | Admitting: Family Medicine

## 2024-03-21 ENCOUNTER — Other Ambulatory Visit: Payer: Self-pay | Admitting: Family Medicine

## 2024-03-21 DIAGNOSIS — E118 Type 2 diabetes mellitus with unspecified complications: Secondary | ICD-10-CM

## 2024-04-06 ENCOUNTER — Encounter (INDEPENDENT_AMBULATORY_CARE_PROVIDER_SITE_OTHER): Payer: Self-pay | Admitting: *Deleted

## 2024-09-23 ENCOUNTER — Other Ambulatory Visit

## 2024-10-01 ENCOUNTER — Encounter: Admitting: Family Medicine
